# Patient Record
Sex: Female | Born: 1955 | Race: Black or African American | Hispanic: No | Smoking: Never smoker
Health system: Southern US, Community
[De-identification: ages and names within clinical notes are randomized; demographics above are authoritative.]

## PROBLEM LIST (undated history)

## (undated) DIAGNOSIS — L509 Urticaria, unspecified: Secondary | ICD-10-CM

## (undated) DIAGNOSIS — K219 Gastro-esophageal reflux disease without esophagitis: Secondary | ICD-10-CM

## (undated) DIAGNOSIS — F419 Anxiety disorder, unspecified: Secondary | ICD-10-CM

## (undated) DIAGNOSIS — T7840XA Allergy, unspecified, initial encounter: Secondary | ICD-10-CM

## (undated) DIAGNOSIS — T783XXA Angioneurotic edema, initial encounter: Secondary | ICD-10-CM

## (undated) DIAGNOSIS — G4730 Sleep apnea, unspecified: Secondary | ICD-10-CM

## (undated) DIAGNOSIS — F32A Depression, unspecified: Secondary | ICD-10-CM

## (undated) DIAGNOSIS — F329 Major depressive disorder, single episode, unspecified: Secondary | ICD-10-CM

## (undated) HISTORY — DX: Anxiety disorder, unspecified: F41.9

## (undated) HISTORY — DX: Allergy, unspecified, initial encounter: T78.40XA

## (undated) HISTORY — DX: Angioneurotic edema, initial encounter: T78.3XXA

## (undated) HISTORY — DX: Gastro-esophageal reflux disease without esophagitis: K21.9

## (undated) HISTORY — PX: NO PAST SURGERIES: SHX2092

## (undated) HISTORY — DX: Urticaria, unspecified: L50.9

## (undated) HISTORY — PX: UPPER GASTROINTESTINAL ENDOSCOPY: SHX188

## (undated) HISTORY — DX: Sleep apnea, unspecified: G47.30

## (undated) HISTORY — PX: BREAST CYST ASPIRATION: SHX578

## (undated) HISTORY — PX: COLONOSCOPY: SHX174

---

## 2004-12-09 ENCOUNTER — Emergency Department (HOSPITAL_COMMUNITY): Admission: EM | Admit: 2004-12-09 | Discharge: 2004-12-09 | Payer: Self-pay | Admitting: Emergency Medicine

## 2011-12-05 ENCOUNTER — Encounter: Payer: Self-pay | Admitting: Family

## 2011-12-05 ENCOUNTER — Ambulatory Visit (INDEPENDENT_AMBULATORY_CARE_PROVIDER_SITE_OTHER): Payer: 59 | Admitting: Family

## 2011-12-05 VITALS — BP 100/58 | Temp 98.8°F | Ht 71.0 in | Wt 196.0 lb

## 2011-12-05 DIAGNOSIS — F410 Panic disorder [episodic paroxysmal anxiety] without agoraphobia: Secondary | ICD-10-CM

## 2011-12-05 DIAGNOSIS — F411 Generalized anxiety disorder: Secondary | ICD-10-CM

## 2011-12-05 DIAGNOSIS — F419 Anxiety disorder, unspecified: Secondary | ICD-10-CM

## 2011-12-05 DIAGNOSIS — F329 Major depressive disorder, single episode, unspecified: Secondary | ICD-10-CM

## 2011-12-05 MED ORDER — CITALOPRAM HYDROBROMIDE 20 MG PO TABS: 20.0000 mg | ORAL_TABLET | Freq: Every day | ORAL | Status: DC

## 2011-12-05 MED ORDER — ALPRAZOLAM 0.25 MG PO TABS: 0.5000 mg | ORAL_TABLET | Freq: Two times a day (BID) | ORAL | Status: AC | PRN

## 2011-12-05 NOTE — Patient Instructions (Signed)

## 2011-12-05 NOTE — Progress Notes (Signed)
  Subjective:    Patient ID: Misty Burke, female    DOB: 1955/12/12, 56 y.o.   MRN: [id number]  HPI 56 year old [place] American female, new patient to the practice is in reestablish. Reports her mother dying at [facility] in February of this year. She's been have an red difficulty adjusting to her mother's death. Reports it being a traumatic death with her vomiting stomach secretions been passing overdid in her hospital bed. Patient is very upset that the doctors and nurses did not attempt around by her mother. She's seeking legal assistance. Her mother had a DO NOT RESUSCITATE order. Now, she has chest tightness, difficulty breathing, crying spells, depression, symptoms of helplessness and hopelessness. She does not have much of a support system, she is the only child and is unmarried with no children. She is from [place] and is only in [place] to settle her mother estate.   Review of Systems  Constitutional: Negative.   Respiratory: Negative.   Cardiovascular: Negative.   Musculoskeletal: Negative.   Skin: Negative.   Neurological: Negative.   Psychiatric/Behavioral: Positive for sleep disturbance, decreased concentration and agitation. The patient is nervous/anxious.    No past medical history on file.  History   Social History  . Marital Status: Single    Spouse Name: N/A    Number of Children: N/A  . Years of Education: N/A   Occupational History  . Not on file.   Social History Main Topics  . Smoking status: Never Smoker   . Smokeless tobacco: Not on file  . Alcohol Use: No  . Drug Use: No  . Sexually Active: Not on file   Other Topics Concern  . Not on file   Social History Narrative  . No narrative on file    No past surgical history on file.  No family history on file.  No Known Allergies  Current Outpatient Prescriptions on File Prior to Visit  Medication Sig Dispense Refill  . citalopram (CELEXA) 20 MG tablet Take  1 tablet (20 mg total) by mouth daily.  30 tablet  3    BP 100/58  Temp(Src) 98.8 F (37.1 C) (Oral)  Ht 5\' 11"  (1.803 m)  Wt 196 lb (88.905 kg)  BMI 27.34 kg/m2chart    Objective:   Physical Exam  Constitutional: She is oriented to person, place, and time. She appears well-developed and well-nourished.  Neck: Normal range of motion. Neck supple.  Cardiovascular: Normal rate, regular rhythm and normal heart sounds.   Pulmonary/Chest: Effort normal and breath sounds normal.  Neurological: She is alert and oriented to person, place, and time.  Skin: Skin is warm and dry.  Psychiatric: She has a normal mood and affect.          Assessment & Plan:  Assessment: Anxiety, depression, panic attacks  Plan: We'll start Celexa 10 mg once a day, Xanax 0.25 mg twice a day when necessary. Advised exercise. We'll followup with patient in 2 weeks and sooner when necessary.

## 2011-12-11 ENCOUNTER — Telehealth: Payer: Self-pay | Admitting: Family

## 2011-12-11 NOTE — Telephone Encounter (Signed)
Pt called re: Celexa. Pt wants to know if she needs to take med a same time each day. Pt said that she took first dose last night and wanted to know if she is suppose to wait until tonight to take again?

## 2011-12-11 NOTE — Telephone Encounter (Signed)
Left message on personally identified voicemail to notify pt that she should take Celexa around the same time daily. Advised pt to call back with further questions or concerns

## 2011-12-12 ENCOUNTER — Other Ambulatory Visit: Payer: Self-pay | Admitting: Family

## 2011-12-12 NOTE — Telephone Encounter (Signed)
Pt aware that it is ok to cut the pill in half and that she should take it around the same time each night.Also advised pt that when making the decision to d/c the meds, she would need to be weaned off of it, it should not be stopped all at once.  Pt verbalized understanding.

## 2011-12-12 NOTE — Telephone Encounter (Signed)
Pt stated celexa 20mg  is too stronger she is cutting pills in half. Celexa 20mg  is making her sleepy and feeling funny in her chest. Please advise. Pt just started taking medication 2 day ago.

## 2011-12-17 ENCOUNTER — Ambulatory Visit (INDEPENDENT_AMBULATORY_CARE_PROVIDER_SITE_OTHER): Payer: 59 | Admitting: Family

## 2011-12-17 ENCOUNTER — Encounter: Payer: Self-pay | Admitting: Family

## 2011-12-17 VITALS — BP 98/62 | HR 84 | Temp 98.6°F | Wt 196.0 lb

## 2011-12-17 DIAGNOSIS — F419 Anxiety disorder, unspecified: Secondary | ICD-10-CM

## 2011-12-17 DIAGNOSIS — F411 Generalized anxiety disorder: Secondary | ICD-10-CM

## 2011-12-17 DIAGNOSIS — F329 Major depressive disorder, single episode, unspecified: Secondary | ICD-10-CM

## 2011-12-17 NOTE — Progress Notes (Signed)
  Subjective:    Patient ID: Misty Burke, female    DOB: [birth year], 56 y.o.   MRN: [id number]  HPI 56 year old [place] American female, nonsmoker is in for recheck depression and anxiety. Patient was started on Celexa 20 mg. Patient reports taking 2 doses of the medication and felt like her chest was tight, headaches, increase in depression, and difficulty swallowing after taking the second dose. She has since discontinued the medication. She is still in [place] although also due to [facility] for her mother's death. However, she is becoming a more discouraged since the autopsy report showed the cause of death was endstage renal disease. Continues to have feelings of helplessness and hopelessness. Denies any thoughts of death or dying or thoughts of suicide.   Review of Systems  Constitutional: Negative.   Respiratory: Positive for chest tightness and shortness of breath.   Cardiovascular: Negative.   Musculoskeletal: Negative.   Skin: Negative.   Neurological: Positive for headaches.  Hematological: Negative.   Psychiatric/Behavioral: The patient is nervous/anxious.    No past medical history on file.  History   Social History  . Marital Status: Single    Spouse Name: N/A    Number of Children: N/A  . Years of Education: N/A   Occupational History  . Not on file.   Social History Main Topics  . Smoking status: Never Smoker   . Smokeless tobacco: Not on file  . Alcohol Use: No  . Drug Use: No  . Sexually Active: Not on file   Other Topics Concern  . Not on file   Social History Narrative  . No narrative on file    No past surgical history on file.  No family history on file.  No Known Allergies  Current Outpatient Prescriptions on File Prior to Visit  Medication Sig Dispense Refill  . ALPRAZolam (XANAX) 0.25 MG tablet Take 2 tablets (0.5 mg total) by mouth 2 (two) times daily as needed for sleep.  60 tablet  0  . citalopram (CELEXA) 20 MG tablet Take 1  tablet (20 mg total) by mouth daily.  30 tablet  3    BP 98/62  Pulse 84  Temp(Src) 98.6 F (37 C) (Oral)  Wt 196 lb (88.905 kg)  SpO2 98%chart    Objective:   Physical Exam  Constitutional: She is oriented to person, place, and time. She appears well-developed and well-nourished.  HENT:  Right Ear: External ear normal.  Left Ear: External ear normal.  Nose: Nose normal.  Mouth/Throat: Oropharynx is clear and moist.  Neck: Normal range of motion. Neck supple.  Cardiovascular: Normal rate, regular rhythm and normal heart sounds.   Pulmonary/Chest: Effort normal and breath sounds normal.  Neurological: She is alert and oriented to person, place, and time.  Skin: Skin is warm and dry.  Psychiatric: She has a normal mood and affect.          Assessment & Plan:  Assessment: Anxiety, Depression  Plan: Patient does not with to take any more SSRI's. I have advised her to take some time away from  since she has some unpleasant memories. Continue to see a therapist. Xanax prn at bedtime. Call if symptoms worsen or persist.

## 2013-06-28 ENCOUNTER — Encounter (HOSPITAL_COMMUNITY): Payer: Self-pay | Admitting: Emergency Medicine

## 2013-06-28 ENCOUNTER — Emergency Department (INDEPENDENT_AMBULATORY_CARE_PROVIDER_SITE_OTHER)
Admission: EM | Admit: 2013-06-28 | Discharge: 2013-06-28 | Disposition: A | Discharge status: Home / Self Care | Payer: Managed Care, Other (non HMO) | Source: Home / Self Care | Attending: Emergency Medicine | Admitting: Emergency Medicine

## 2013-06-28 DIAGNOSIS — L089 Local infection of the skin and subcutaneous tissue, unspecified: Secondary | ICD-10-CM

## 2013-06-28 DIAGNOSIS — Z23 Encounter for immunization: Secondary | ICD-10-CM

## 2013-06-28 DIAGNOSIS — S61209A Unspecified open wound of unspecified finger without damage to nail, initial encounter: Secondary | ICD-10-CM

## 2013-06-28 MED ORDER — TETANUS-DIPHTHERIA TOXOIDS TD 5-2 LFU IM INJ
0.5000 mL | INJECTION | Freq: Once | INTRAMUSCULAR | Status: AC
  Administered 2013-06-28: 0.5 mL via INTRAMUSCULAR

## 2013-06-28 MED ORDER — TETANUS-DIPHTH-ACELL PERTUSSIS 5-2.5-18.5 LF-MCG/0.5 IM SUSP
INTRAMUSCULAR | Status: AC
  Filled 2013-06-28: qty 0.5

## 2013-06-28 MED ORDER — CEPHALEXIN 500 MG PO CAPS: 500.0000 mg | ORAL_CAPSULE | Freq: Four times a day (QID) | ORAL | Status: DC

## 2013-06-28 NOTE — ED Provider Notes (Signed)
CSN: [id number]     Arrival date & time 06/28/13  1833 History   First MD Initiated Contact with Patient 06/28/13 1912     Chief Complaint  Patient presents with  . Hand Problem   (Consider location/radiation/quality/duration/timing/severity/associated sxs/prior Treatment) HPI Comments: Ms. Boyack presents to the UC for evaluation of a painful right thumb. She state symptoms began about one month ago after working in her yard, pulling and clearing cines. States she thought she suffered a puncture wound from one of the thorns on a vine. States she thought she was able to remove the foreign body, but she continues to have discomfort, redness and swelling where injury occurred. Denies any visible foreign body, open wound or drainage from area. Tetanus status unknown.   The history is provided by the patient.    History reviewed. No pertinent past medical history. History reviewed. No pertinent past surgical history. History reviewed. No pertinent family history. History  Substance Use Topics  . Smoking status: Never Smoker   . Smokeless tobacco: Not on file  . Alcohol Use: No   OB History   Grav Para Term Preterm Abortions TAB SAB Ect Mult Living                 Review of Systems  All other systems reviewed and are negative.    Allergies  Review of patient's allergies indicates no known allergies.  Home Medications   Current Outpatient Rx  Name  Route  Sig  Dispense  Refill  . cephALEXin (KEFLEX) 500 MG capsule   Oral   Take 1 capsule (500 mg total) by mouth 4 (four) times daily. X 7 days   28 capsule   0   . EXPIRED: citalopram (CELEXA) 20 MG tablet   Oral   Take 1 tablet (20 mg total) by mouth daily.   30 tablet   3    BP 118/81  Pulse 71  Temp(Src) 98 F (36.7 C) (Oral)  Resp 16  SpO2 100% Physical Exam  Nursing note and vitals reviewed. Constitutional: She is oriented to person, place, and time. She appears well-developed and well-nourished. No distress.    HENT:  Head: Normocephalic and atraumatic.  Cardiovascular: Normal rate and regular rhythm.   Pulmonary/Chest: Effort normal and breath sounds normal.  Abdominal: Bowel sounds are normal. There is no tenderness.  Musculoskeletal:       Hands: Neurological: She is alert and oriented to person, place, and time.  Skin: Skin is warm and dry. There is erythema.  Psychiatric: She has a normal mood and affect. Her behavior is normal.    ED Course  Procedures (including critical care time) Labs Review Labs Reviewed - No data to display Imaging Review No results found.  EKG Interpretation    Date/Time:    Ventricular Rate:    PR Interval:    QRS Duration:   QT Interval:    QTC Calculation:   R Axis:     Text Interpretation:              MDM   1. Laceration of thumb with infection, right, initial encounter     Advised that it would be best for her to follow up with a hand specialist for evaluation of possible retained foreign body in right thumb. Tetanus booster given.    Jess Barters [place], [place] 06/29/13 0800

## 2013-06-28 NOTE — ED Notes (Signed)
C/o possible infection of the thumb of the right hand. States 2 wks ago got caught in vine.  Having pain and mild swelling. Pt has used rubbing alcohol and tried to lance/drain area with no relief.

## 2013-06-29 NOTE — ED Provider Notes (Signed)
Medical screening examination/treatment/procedure(s) were performed by non-physician practitioner and as supervising physician I was immediately available for consultation/collaboration.  Leslee Home, M.D.   Reuben Likes, MD 06/29/13 [id number]

## 2013-06-29 NOTE — ED Notes (Signed)
Pt. called and said that Dr. Butler Denmark does not accept her insurance.  I told her to call her insurance company and ask what hand specialist they do cover.  She does not know the number of [employer].  She asked me to look up the number for her because she lost her card.  I told her I would call back. I had Aram Beecham look but she said the only cards on her file were [employer] and Medicare. She tried to put pt.'s [employer] number in the program to get their phone number but it declined her number. I called pt. back and gave her this information. She said she called the pharmacy and they had her card on file. She was able to get her Rx.'s and got a number to call tomorrow about ortho referral. I told her we can send her record wherever she needs [place] to send it. Vassie Moselle 06/29/2013

## 2013-07-01 NOTE — ED Notes (Signed)
Chart review; message still on answering machine, appears to be resolved

## 2014-02-20 ENCOUNTER — Emergency Department (INDEPENDENT_AMBULATORY_CARE_PROVIDER_SITE_OTHER): Payer: Managed Care, Other (non HMO)

## 2014-02-20 ENCOUNTER — Emergency Department (INDEPENDENT_AMBULATORY_CARE_PROVIDER_SITE_OTHER)
Admission: EM | Admit: 2014-02-20 | Discharge: 2014-02-20 | Disposition: A | Discharge status: Home / Self Care | Payer: Managed Care, Other (non HMO) | Source: Home / Self Care | Attending: Family Medicine | Admitting: Family Medicine

## 2014-02-20 ENCOUNTER — Encounter (HOSPITAL_COMMUNITY): Payer: Self-pay | Admitting: Emergency Medicine

## 2014-02-20 DIAGNOSIS — J45909 Unspecified asthma, uncomplicated: Secondary | ICD-10-CM

## 2014-02-20 DIAGNOSIS — J4530 Mild persistent asthma, uncomplicated: Secondary | ICD-10-CM

## 2014-02-20 LAB — POCT URINALYSIS DIP (DEVICE)
BILIRUBIN URINE: NEGATIVE
Glucose, UA: NEGATIVE mg/dL
KETONES UR: NEGATIVE mg/dL
Leukocytes, UA: NEGATIVE
Nitrite: NEGATIVE
PH: 5 (ref 5.0–8.0)
Protein, ur: NEGATIVE mg/dL
Specific Gravity, Urine: >=1.03 (ref 1.005–1.030)
Urobilinogen, UA: 0.2 mg/dL (ref 0.0–1.0)

## 2014-02-20 MED ORDER — TRIAMCINOLONE ACETONIDE 40 MG/ML IJ SUSP
40.0000 mg | Freq: Once | INTRAMUSCULAR | Status: AC
  Administered 2014-02-20: 40 mg via INTRAMUSCULAR

## 2014-02-20 MED ORDER — IPRATROPIUM BROMIDE 0.06 % NA SOLN: 2.0000 | Freq: Four times a day (QID) | NASAL | Status: DC

## 2014-02-20 MED ORDER — TRIAMCINOLONE ACETONIDE 40 MG/ML IJ SUSP
INTRAMUSCULAR | Status: AC
  Filled 2014-02-20: qty 1

## 2014-02-20 MED ORDER — METHYLPREDNISOLONE 4 MG PO KIT: PACK | ORAL | Status: DC

## 2014-02-20 NOTE — ED Notes (Signed)
Pt  Has  Multiple  Symptoms  To  Include    Cough   /  Congestion       Weakness   And  Cough  For  About  6  Weeks  The pt  Reports       Pt    Also  Reports      Some  Dark  Foul  Urine  As  Well

## 2014-02-20 NOTE — ED Provider Notes (Signed)
CSN: [id number]     Arrival date & time 02/20/14  1516 History   First MD Initiated Contact with Patient 02/20/14 1540     Chief Complaint  Patient presents with  . URI   (Consider location/radiation/quality/duration/timing/severity/associated sxs/prior Treatment) Patient is a 58 y.o. female presenting with URI. The history is provided by the patient.  URI Presenting symptoms: congestion, cough, fatigue and rhinorrhea   Severity:  Moderate Onset quality:  Gradual Duration:  6 weeks Progression:  Unchanged Chronicity:  New (down from [place] to settle mother's estate after death.) Relieved by:  None tried Ineffective treatments:  None tried Associated symptoms: no wheezing   Risk factors: recent travel     History reviewed. No pertinent past medical history. History reviewed. No pertinent past surgical history. History reviewed. No pertinent family history. History  Substance Use Topics  . Smoking status: Never Smoker   . Smokeless tobacco: Not on file  . Alcohol Use: No   OB History   Grav Para Term Preterm Abortions TAB SAB Ect Mult Living                 Review of Systems  Constitutional: Positive for fatigue.  HENT: Positive for congestion, postnasal drip and rhinorrhea.   Respiratory: Positive for cough. Negative for shortness of breath and wheezing.   Gastrointestinal: Negative.   Genitourinary: Negative.     Allergies  Review of patient's allergies indicates no known allergies.  Home Medications   Prior to Admission medications   Medication Sig Start Date End Date Taking? Authorizing Provider  cephALEXin (KEFLEX) 500 MG capsule Take 1 capsule (500 mg total) by mouth 4 (four) times daily. X 7 days 06/28/13   Ardis RowanJennifer Lee Presson, PA  citalopram (CELEXA) 20 MG tablet Take 1 tablet (20 mg total) by mouth daily. 12/05/11 12/04/12  Baker PieriniPadonda B Campbell, FNP  ipratropium (ATROVENT) 0.06 % nasal spray Place 2 sprays into both nostrils 4 (four) times daily. 02/20/14   Linna HoffJames D  Tanisha Lutes, MD  methylPREDNISolone (MEDROL DOSEPAK) 4 MG tablet follow package directions, start on tues, take until finished. 02/20/14   Linna HoffJames D Deshawnda Acrey, MD   BP 119/82  Pulse 79  Temp(Src) 98.4 F (36.9 C) (Oral)  Resp 18  Ht 5\' 10"  (1.778 m)  Wt 195 lb (88.451 kg)  BMI 27.98 kg/m2  SpO2 96% Physical Exam  Nursing note and vitals reviewed. Constitutional: She is oriented to person, place, and time. She appears well-developed and well-nourished.  HENT:  Right Ear: External ear normal.  Left Ear: External ear normal.  Mouth/Throat: Oropharynx is clear and moist.  Eyes: Pupils are equal, round, and reactive to light.  Neck: Normal range of motion. Neck supple.  Cardiovascular: Regular rhythm, normal heart sounds and intact distal pulses.   Pulmonary/Chest: Effort normal and breath sounds normal.  Neurological: She is alert and oriented to person, place, and time.  Skin: Skin is warm and dry.    ED Course  Procedures (including critical care time) Labs Review Labs Reviewed  POCT URINALYSIS DIP (DEVICE) - Abnormal; Notable for the following:    Hgb urine dipstick TRACE (*)    All other components within normal limits    Imaging Review Dg Chest 2 View  02/20/2014   CLINICAL DATA:  Cough and congestion and chest tightness for 4 weeks  EXAM: CHEST  2 VIEW  COMPARISON:  None.  FINDINGS: There is mild elevation of the right hemidiaphragm. Anteriorly. The lungs are well-expanded and clear. There is no  focal infiltrate. The heart and pulmonary vascularity are normal. There is no pleural effusion. The bony thorax is unremarkable.  IMPRESSION: There is no acute cardiopulmonary abnormality.   Electronically Signed   By: David  [place]   On: 02/20/2014 16:44   X-rays reviewed and report per radiologist.   MDM   1. Bronchitis, allergic, mild persistent, uncomplicated        Linna Hoff, MD 02/20/14 1654

## 2014-03-01 ENCOUNTER — Encounter (HOSPITAL_COMMUNITY): Payer: Self-pay | Admitting: Emergency Medicine

## 2014-03-01 ENCOUNTER — Emergency Department (INDEPENDENT_AMBULATORY_CARE_PROVIDER_SITE_OTHER)
Admission: EM | Admit: 2014-03-01 | Discharge: 2014-03-01 | Disposition: A | Discharge status: Home / Self Care | Payer: Managed Care, Other (non HMO) | Source: Home / Self Care | Attending: Emergency Medicine | Admitting: Emergency Medicine

## 2014-03-01 DIAGNOSIS — R05 Cough: Secondary | ICD-10-CM

## 2014-03-01 DIAGNOSIS — J209 Acute bronchitis, unspecified: Secondary | ICD-10-CM

## 2014-03-01 DIAGNOSIS — J019 Acute sinusitis, unspecified: Secondary | ICD-10-CM

## 2014-03-01 DIAGNOSIS — H5319 Other subjective visual disturbances: Secondary | ICD-10-CM

## 2014-03-01 DIAGNOSIS — R059 Cough, unspecified: Secondary | ICD-10-CM

## 2014-03-01 LAB — POCT I-STAT, CHEM 8
BUN: 17 mg/dL (ref 6–23)
CHLORIDE: 104 meq/L (ref 96–112)
Calcium, Ion: 1.16 mmol/L (ref 1.12–1.23)
Creatinine, Ser: 1 mg/dL (ref 0.50–1.10)
Glucose, Bld: 88 mg/dL (ref 70–99)
HCT: 42 % (ref 36.0–46.0)
Hemoglobin: 14.3 g/dL (ref 12.0–15.0)
POTASSIUM: 4.1 meq/L (ref 3.7–5.3)
Sodium: 137 mEq/L (ref 137–147)
TCO2: 26 mmol/L (ref 0–100)

## 2014-03-01 MED ORDER — HYDROCOD POLST-CHLORPHEN POLST 10-8 MG/5ML PO LQCR: 5.0000 mL | Freq: Two times a day (BID) | ORAL | Status: DC | PRN

## 2014-03-01 MED ORDER — FLUTICASONE PROPIONATE 50 MCG/ACT NA SUSP: 2.0000 | Freq: Every day | NASAL | Status: DC

## 2014-03-01 MED ORDER — AMOXICILLIN 500 MG PO CAPS: 1000.0000 mg | ORAL_CAPSULE | Freq: Three times a day (TID) | ORAL | Status: DC

## 2014-03-01 MED ORDER — ALBUTEROL SULFATE HFA 108 (90 BASE) MCG/ACT IN AERS: 2.0000 | INHALATION_SPRAY | Freq: Four times a day (QID) | RESPIRATORY_TRACT | Status: DC

## 2014-03-01 NOTE — Discharge Instructions (Signed)
How to Use an Inhaler °Proper inhaler technique is very important. Good technique ensures that the medicine reaches the lungs. Poor technique results in depositing the medicine on the tongue and back of the throat rather than in the airways. If you do not use the inhaler with good technique, the medicine will not help you. °STEPS TO FOLLOW IF USING AN INHALER WITHOUT AN EXTENSION TUBE °1. Remove the cap from the inhaler. °2. If you are using the inhaler for the first time, you will need to prime it. Shake the inhaler for 5 seconds and release four puffs into the air, away from your face. Ask your health care provider or pharmacist if you have questions about priming your inhaler. °3. Shake the inhaler for 5 seconds before each breath in (inhalation). °4. Position the inhaler so that the top of the canister faces up. °5. Put your index finger on the top of the medicine canister. Your thumb supports the bottom of the inhaler. °6. Open your mouth. °7. Either place the inhaler between your teeth and place your lips tightly around the mouthpiece, or hold the inhaler 1-2 inches away from your open mouth. If you are unsure of which technique to use, ask your health care provider. °8. Breathe out (exhale) normally and as completely as possible. °9. Press the canister down with your index finger to release the medicine. °10. At the same time as the canister is pressed, inhale deeply and slowly until your lungs are completely filled. This should take 4-6 seconds. Keep your tongue down. °11. Hold the medicine in your lungs for 5-10 seconds (10 seconds is best). This helps the medicine get into the small airways of your lungs. °12. Breathe out slowly, through pursed lips. Whistling is an example of pursed lips. °13. Wait at least 15-30 seconds between puffs. Continue with the above steps until you have taken the number of puffs your health care provider has ordered. Do not use the inhaler more than your health care provider  tells you. °14. Replace the cap on the inhaler. °15. Follow the directions from your health care provider or the inhaler insert for cleaning the inhaler. °STEPS TO FOLLOW IF USING AN INHALER WITH AN EXTENSION (SPACER) °1. Remove the cap from the inhaler. °2. If you are using the inhaler for the first time, you will need to prime it. Shake the inhaler for 5 seconds and release four puffs into the air, away from your face. Ask your health care provider or pharmacist if you have questions about priming your inhaler. °3. Shake the inhaler for 5 seconds before each breath in (inhalation). °4. Place the open end of the spacer onto the mouthpiece of the inhaler. °5. Position the inhaler so that the top of the canister faces up and the spacer mouthpiece faces you. °6. Put your index finger on the top of the medicine canister. Your thumb supports the bottom of the inhaler and the spacer. °7. Breathe out (exhale) normally and as completely as possible. °8. Immediately after exhaling, place the spacer between your teeth and into your mouth. Close your lips tightly around the spacer. °9. Press the canister down with your index finger to release the medicine. °10. At the same time as the canister is pressed, inhale deeply and slowly until your lungs are completely filled. This should take 4-6 seconds. Keep your tongue down and out of the way. °11. Hold the medicine in your lungs for 5-10 seconds (10 seconds is best). This helps the   medicine get into the small airways of your lungs. Exhale. °12. Repeat inhaling deeply through the spacer mouthpiece. Again hold that breath for up to 10 seconds (10 seconds is best). Exhale slowly. If it is difficult to take this second deep breath through the spacer, breathe normally several times through the spacer. Remove the spacer from your mouth. °13. Wait at least 15-30 seconds between puffs. Continue with the above steps until you have taken the number of puffs your health care provider has  ordered. Do not use the inhaler more than your health care provider tells you. °14. Remove the spacer from the inhaler, and place the cap on the inhaler. °15. Follow the directions from your health care provider or the inhaler insert for cleaning the inhaler and spacer. °If you are using different kinds of inhalers, use your quick relief medicine to open the airways 10-15 minutes before using a steroid if instructed to do so by your health care provider. If you are unsure which inhalers to use and the order of using them, ask your health care provider, nurse, or respiratory therapist. °If you are using a steroid inhaler, always rinse your mouth with water after your last puff, then gargle and spit out the water. Do not swallow the water. °AVOID: °· Inhaling before or after starting the spray of medicine. It takes practice to coordinate your breathing with triggering the spray. °· Inhaling through the nose (rather than the mouth) when triggering the spray. °HOW TO DETERMINE IF YOUR INHALER IS FULL OR NEARLY EMPTY °You cannot know when an inhaler is empty by shaking it. A few inhalers are now being made with dose counters. Ask your health care provider for a prescription that has a dose counter if you feel you need that extra help. If your inhaler does not have a counter, ask your health care provider to help you determine the date you need to refill your inhaler. Write the refill date on a calendar or your inhaler canister. Refill your inhaler 7-10 days before it runs out. Be sure to keep an adequate supply of medicine. This includes making sure it is not expired, and that you have a spare inhaler.  °SEEK MEDICAL CARE IF:  °· Your symptoms are only partially relieved with your inhaler. °· You are having trouble using your inhaler. °· You have some increase in phlegm. °SEEK IMMEDIATE MEDICAL CARE IF:  °· You feel little or no relief with your inhalers. You are still wheezing and are feeling shortness of breath or  tightness in your chest or both. °· You have dizziness, headaches, or a fast heart rate. °· You have chills, fever, or night sweats. °· You have a noticeable increase in phlegm production, or there is blood in the phlegm. °MAKE SURE YOU:  °· Understand these instructions. °· Will watch your condition. °· Will get help right away if you are not doing well or get worse. °Document Released: 07/11/2000 Document Revised: 05/04/2013 Document Reviewed: 02/10/2013 °[employer]® Patient Information ©2015 [employer]. This information is not intended to replace advice given to you by your health care provider. Make sure you discuss any questions you have with your health care provider. ° °Most upper respiratory infections are caused by viruses and do not require antibiotics.  We try to save the antibiotics for when we really need them to prevent bacteria from developing resistance to them.  Here are a few hints about things that can be done at home to help get over an   upper respiratory infection quicker: ° °Get extra sleep and extra fluids.  Get 7 to 9 hours of sleep per night and 6 to 8 glasses of water a day.  Getting extra sleep keeps the immune system from getting run down.  Most people with an upper respiratory infection are a little dehydrated.  The extra fluids also keep the secretions liquified and easier to deal with.  Also, get extra vitamin C.  4000 mg per day is the recommended dose. °For the aches, headache, and fever, acetaminophen or ibuprofen are helpful.  These can be alternated every 4 hours.  People with liver disease should avoid large amounts of acetaminophen, and people with ulcer disease, gastroesophageal reflux, gastritis, congestive heart failure, chronic kidney disease, coronary artery disease and the elderly should avoid ibuprofen. °For nasal congestion try Mucinex-D, or if you're having lots of sneezing or clear nasal drainage use Zyrtec-D. People with high blood pressure can take these if their  blood pressure is controlled, if not, it's best to avoid the forms with a "D" (decongestants).  You can use the plain Mucinex, Allegra, Claritin, or Zyrtec even if your blood pressure is not controlled.   °A Saline nasal spray such as Ocean Spray can also help.  You can add a decongestant sprays such as Afrin, but you should not use the decongestant sprays for more than 3 or 4 days since they can be habituating.  Breathe Rite nasal strips can also offer a non-drug alternative treatment to nasal congestion, especially at night. °For people with symptoms of sinusitis, sleeping with your head elevated can be helpful.  For sinus pain, moist, hot compresses to the face may provide some relief.  Many people find that inhaling steam as in a shower or from a pot of steaming water can help. °For any viral infection, zinc containing lozenges such as Cold-Eze or Zicam are helpful.  Zinc helps to fight viral infection.  Hot salt water gargles (8 oz of hot water, 1/2 tsp of table salt, and a pinch of baking soda) can give relief as well as hot beverages such as hot tea.  Sucrets extra strength lozenges will help the sore throat.  °For the cough, take Delsym 2 tsp every 12 hours.  It has also been found recently that Aleve can help control a cough.  The dose is 1 to 2 tablets twice daily with food.  This can be combined with Delsym. (Note, if you are taking ibuprofen, you should not take Aleve as well--take one or the other.) °A cool mist vaporizer will help keep your mucous membranes from drying out.  ° °It's important when you have an upper respiratory infection not to pass the infection to others.  This involves being very careful about the following: ° °Frequent hand washing or use of hand sanitizer, especially after coughing, sneezing, blowing your nose or touching your face, nose or eyes. °Do not shake hands or touch anyone and try to avoid touching surfaces that other people use such as doorknobs, shopping carts,  telephones and computer keyboards. °Use tissues and dispose of them properly in a garbage can or ziplock bag. °Cough into your sleeve. °Do not let others eat or drink after you. ° °It's also important to recognize the signs of serious illness and get evaluated if they occur: °Any respiratory infection that lasts more than 7 to 10 days.  Yellow nasal drainage and sputum are not reliable indicators of a bacterial infection, but if they last for more than   1 week, see your doctor. Fever and sore throat can indicate strep. Fever and cough can indicate influenza or pneumonia. Any kind of severe symptom such as difficulty breathing, intractable vomiting, or severe pain should prompt you to see a doctor as soon as possible.   Your body's immune system is really the thing that will get rid of this infection.  Your immune system is comprised of 2 types of specialized cells called T cells and B cells.  T cells coordinate the array of cells in your body that engulf invading bacteria or viruses while B cells orchestrate the production of antibodies that neutralize infection.  Anything we do or any medications we give you, will just strengthen your immune system or help it clear up the infection quicker.  Here are a few helpful hints to improve your immune system to help overcome this illness or to prevent future infections:  A few vitamins can improve the health of your immune system.  That's why your diet should include plenty of fruits, vegetables, fish, nuts, and whole grains.  Vitamin A and bet-carotene can increase the cells that fight infections (T cells and B cells).  Vitamin A is abundant in dark greens and orange vegetables such as spinach, greens, sweet potatoes, and carrots.  Vitamin B6 contributes to the maturation of white blood cells, the cells that fight disease.  Foods with vitamin B6 include cold cereal and bananas.  Vitamin C is credited with preventing colds because it increases white blood cells  and also prevents cellular damage.  Citrus fruits, peaches and green and red bell peppers are all hight in vitamin C.  Vitamin E is an anti-oxidant that encourages the production of natural killer cells which reject foreign invaders and B cells that produce antibodies.  Foods high in vitamin E include wheat germ, nuts and seeds.  Foods high in omega-3 fatty acids found in foods like salmon, tuna and mackerel boost your immune system and help cells to engulf and absorb germs.  Probiotics are good bacteria that increase your T cells.  These can be found in yogurt and are available in supplements such as Culturelle or Align.  Moderate exercise increases the strength of your immune system and your ability to recover from illness.  I suggest 3 to 5 moderate intensity 30 minute workouts per week.    Sleep is another component of maintaining a strong immune system.  It enables your body to recuperate from the day's activities, stress and work.  My recommendation is to get between 7 and 9 hours of sleep per night.  If you smoke, try to quit completely or at least cut down.  Drink alcohol only in moderation if at all.  No more than 2 drinks daily for men or 1 for women.  Get a flu vaccine early in the fall or if you have not gotten one yet, once this illness has run its course.  If you are over 65, a smoker, or an asthmatic, get a pneumococcal vaccine.  My final recommendation is to maintain a healthy weight.  Excess weight can impair the immune system by interfering with the way the immune system deals with invading viruses or bacteria.   People who suffer from allergies frequently have symptoms of nasal congestion, runny nose, sneezing, itching of the nose, eyes, ears or throat, mucous in the throat, watering of the eyes and cough.  These symptoms are caused by the body's immune response to environmental allergens.  For seasonal allergies this  is pollen (tree pollen in the spring, grass pollen in the  summer, and weed pollen in the fall).  Year round allergy symptoms are usually caused by dust or mould.  Many people have year round symptoms which are worse seasonally.  For people who have seasonal allergies, pollen avoidance may help to decease symptoms.  This means keeping windows in the house down and windows in the car up.  Run your air conditioning, since this filters out many of the pollen particles.  If you have to spend a prolonged time outdoors during heavy pollen season, it might be prudent to wear a mask.  These can be purchased at any drug store.  When you come in after heavy pollen exposure, your skin, clothing and hair are covered with pollen.  Changing your clothing, taking a shower, and washing your hair may help with your pollen exposure.  Also, your bedding, pillow, and pillowcase may become contaminated with pollen, so frequent washing of your bedding and pillowcase and changing out your pillow may help as well.  (Your pillow can also be a source of dust and mould exposure as well.)  Showering at bedtime may also help.  During heavy pollen season (April and September), a large amount of pollen gets trapped in your nasal cavity.  This can contribute to ongoing allergy symptoms.  Saline irrigation of the nasal cavity can help to remove this and relieve allergy symptoms.  This can be accomplished in several ways.  You can mix up your own saline solution using the following recipe:  8 oz of distilled or boiled water, 1/2 tsp of table salt (sodium choride), and a pinch of baking soda (sodium bicarbonate).  If nasal congestion is a problem.  1 to 2 drops of Afrin solution can be added to this as well.  To do the irrigation, purchase a nasal bulb syringe (the kind you would use to clean out an infant's nose).  Fill this up with the solution, lean you head over a sink with the nostril to be irrigated turned upward, insert the syringe into your nostril, making a tight seal, and gently irrigate,  compressing the bulb.  The solution will flow into your nostril and out the other, some may also come out of your mouth.  Repeat this on both sides.  You can do this once daily.  Do not store the solution, mix it up fresh each day.  A commercial solution, called Neomed Solution, can be purchased over the counter without prescription.  You can also use a Netti Pot for irrigation.  These can be purchased at your drug store as well.  Be sure to use distilled or boiled water in these as well and make sure the Netti pot is completely dry between uses.  Over the counter medications can be helpful, and in many cases can completely control allergy symptoms without resorting to more expensive prescription meds.   Antihistamines are the mainstay of allergy treatment.  The newer non-sedating antihistamines are all available over the counter.  These include Allegra, Zyrtec, and Claritin which also can be purchased in their generic forms: fexofenadine, cetirizine, and  Cetirizine.  Combining these meds with a decongestant such as pseudoephedrine or phenylephrine helps with nasal congestion, but decongestants can also cause elevations in blood pressure.  Pseudoephedrine tends to be more effective than phenylephrine.  The older, more sedating antihistamines such as chlorpheniramine, brompheniramine, and diphenhydramine are also very effective, sometimes more so than the newer antihistamines, but with the  price of more sedation.  You should be careful about driving or operating heavy machinery when taking sedating antihistamines, and men with enlarged prostates may experience urinary retention with diphenhydramine.  Naslacrom nasal spray can be very effective for allergy symptoms.  It is available over the counter and has very few side effects.  The dosage is 2 sprays in each nostril twice daily.  It is recommended that you pinch your nose shut for 30 seconds after using it since it is a watery spray and can run out.  It can  be used as long as needed.  There is no risk of dependency.  For people with year round allergies, dust, mould, insect emanations, and pet dander are usually the culprits.  To avoid dust, you need to avoid dust mites which are the main source of allergens in house dust.  Cover your bedding with moisture and mite impervious covers.  These can be purchased at any mattress store.  The modern covers are a little expensive, but not at all uncomfortable. Keeping your house as dry as possible will also help to control dust mites.  Do not use a humidifier and it may help to use a dehumidifier.  Use of a HEPA filter air filter is also a great way to reduce dust and mold exposure.  These units can be purchased commercially.  Make sure to buy one large enough for the room you intend to use it.  Change the filter as per the manufacturer's instructions.  Also, using a HEPA filter vacuum for your carpets is helpful.  There are chemicals that you can sprinkle on your carpet called acaricides that will kill dist mites.  The most commonly used brand is Acarosan.  This can be purchased on line.  It does have to be periodically reapplied.  Wash you pillows and bedsheets regularly in hot water.

## 2014-03-01 NOTE — ED Notes (Signed)
Discussed medication precautions and compliance

## 2014-03-01 NOTE — ED Provider Notes (Signed)
Chief Complaint   Chief Complaint  Patient presents with  . Cough    History of Present Illness   Misty Burke is a 58 year old female who was here a week ago with nasal congestion and cough. She was prescribed a prednisone taper, given Depo-Medrol 80 mg IM, and given Atrovent nasal spray. She's getting better but still not completely well yet. She states she blew a large amount of white mucus. She's had a headache and facial pain. She thinks the prednisone caused a number of side effects. She complains of nervousness, twitching of the left eye, light flashes in the left eye, and some visual distortion. The light flashes and visual distortion have gotten better. She denies any eye pain. She's also had headaches, felt dizziness, and some left temporal pain, paroxysmal cough, heart palpitations, generalized weakness, tremulousness, fatigue, and belching. She denies any fever or chills.  Review of Systems   Other than as noted above, the patient denies any of the following symptoms: Systemic:  No fevers, chills, sweats, or myalgias. Eye:  No redness or discharge. ENT:  No ear pain, headache, nasal congestion, drainage, sinus pressure, or sore throat. Neck:  No neck pain, stiffness, or swollen glands. Lungs:  No cough, sputum production, hemoptysis, wheezing, chest tightness, shortness of breath or chest pain. GI:  No abdominal pain, nausea, vomiting or diarrhea.  PMFSH   Past medical history, family history, social history, meds, and allergies were reviewed.   Physical exam   Vital signs:  BP 112/69  Pulse 100  Temp(Src) 98.2 F (36.8 C) (Oral)  SpO2 100% General:  Alert and oriented.  In no distress.  Skin warm and dry. Eye:  No conjunctival injection or drainage. Lids were normal. PERRLA, full EOMs, pupils were small and pinpoint, and unable to see fundi. ENT:  TMs and canals were normal, without erythema or inflammation.  Nasal mucosa was congested and, without drainage.  Mucous  membranes were moist.  Pharynx was clear with no exudate or drainage.  There were no oral ulcerations or lesions. Neck:  Supple, no adenopathy, tenderness or mass. Lungs:  No respiratory distress.  Lungs were clear to auscultation, without wheezes, rales or rhonchi.  Breath sounds were clear and equal bilaterally.  Heart:  Regular rhythm, without gallops, murmers or rubs. Abdomen: Soft, nontender, no organomegaly or mass. Skin:  Clear, warm, and dry, without rash or lesions.  Labs   Results for orders placed during the hospital encounter of 03/01/14  POCT I-STAT, CHEM 8      Result Value Ref Range   Sodium 137  137 - 147 mEq/L   Potassium 4.1  3.7 - 5.3 mEq/L   Chloride 104  96 - 112 mEq/L   BUN 17  6 - 23 mg/dL   Creatinine, Ser 1.611.00  0.50 - 1.10 mg/dL   Glucose, Bld 88  70 - 99 mg/dL   Calcium, Ion 0.961.16  0.451.12 - 1.23 mmol/L   TCO2 26  0 - 100 mmol/L   Hemoglobin 14.3  12.0 - 15.0 g/dL   HCT 40.942.0  81.136.0 - 91.446.0 %     Radiology   Dg Chest 2 View  02/20/2014   CLINICAL DATA:  Cough and congestion and chest tightness for 4 weeks  EXAM: CHEST  2 VIEW  COMPARISON:  None.  FINDINGS: There is mild elevation of the right hemidiaphragm. Anteriorly. The lungs are well-expanded and clear. There is no focal infiltrate. The heart and pulmonary vascularity are normal. There is  no pleural effusion. The bony thorax is unremarkable.  IMPRESSION: There is no acute cardiopulmonary abnormality.   Electronically Signed   By: Uzma Hellmer  [place]   On: 02/20/2014 16:44   Assessment     The primary encounter diagnosis was Cough. Diagnoses of Acute sinusitis, recurrence not specified, unspecified location, Acute bronchitis, unspecified organism, and Photopsia were also pertinent to this visit.  Her respiratory symptoms may be a combination of allergy and infection. In addition she may have had some intolerance to the prednisone. I can't explain the flashing and visual distortion that she saw in her left thigh, but  it has gotten better, so I don't think it's likely to be a retinal detachment. She does need to see an ophthalmologist in the short-term.  Plan    1.  Meds:  The following meds were prescribed:   New Prescriptions   ALBUTEROL (PROVENTIL HFA;VENTOLIN HFA) 108 (90 BASE) MCG/ACT INHALER    Inhale 2 puffs into the lungs 4 (four) times daily.   AMOXICILLIN (AMOXIL) 500 MG CAPSULE    Take 2 capsules (1,000 mg total) by mouth 3 (three) times daily.   CHLORPHENIRAMINE-HYDROCODONE (TUSSIONEX) 10-8 MG/5ML LQCR    Take 5 mLs by mouth every 12 (twelve) hours as needed for cough.   FLUTICASONE (FLONASE) 50 MCG/ACT NASAL SPRAY    Place 2 sprays into both nostrils daily.    2.  Patient Education/Counseling:  The patient was given appropriate handouts, self care instructions, and instructed in symptomatic relief.  Instructed to get extra fluids, rest, and use a cool mist vaporizer.    3.  Follow up:  The patient was told to follow up here if no better in 3 to 4 days, or sooner if becoming worse in any way, and given some red flag symptoms such as increasing fever, difficulty breathing, chest pain, or persistent vomiting which would prompt immediate return.  Follow up with ophthalmology within the next 2 days. She was given the name of Dr. Shade Flood in [place] to followup with.      Reuben Likes, MD 03/01/14 [phone]

## 2014-03-01 NOTE — ED Notes (Signed)
C/o cough, congestion, , reportedly having copious amts of thick white secretions, cough worse at night and just wont go away

## 2014-03-02 ENCOUNTER — Telehealth (HOSPITAL_COMMUNITY): Payer: Self-pay | Admitting: *Deleted

## 2014-03-02 NOTE — ED Notes (Signed)
Pt. called on VM 8/5 and said she was seen for bronchitis and is on Prednisone.  She said she was seeing flashing light in her L eye and had a thumping in her eye, and her cough is causing a headache. She wants to know if this is normal.  I called pt. back today. She said she came in yesterday and was rechecked.  They gave her a referral to an eye doctor. I told her she did the right thing to come back.  She thanked me for calling. Vassie MoselleYork, Ophie Burrowes M 03/02/2014

## 2014-03-31 ENCOUNTER — Emergency Department (INDEPENDENT_AMBULATORY_CARE_PROVIDER_SITE_OTHER)
Admission: EM | Admit: 2014-03-31 | Discharge: 2014-03-31 | Disposition: A | Discharge status: Home / Self Care | Payer: Managed Care, Other (non HMO) | Source: Home / Self Care | Attending: Family Medicine | Admitting: Family Medicine

## 2014-03-31 ENCOUNTER — Encounter (HOSPITAL_COMMUNITY): Payer: Self-pay | Admitting: Emergency Medicine

## 2014-03-31 DIAGNOSIS — R059 Cough, unspecified: Secondary | ICD-10-CM

## 2014-03-31 DIAGNOSIS — R058 Other specified cough: Secondary | ICD-10-CM

## 2014-03-31 DIAGNOSIS — R05 Cough: Secondary | ICD-10-CM

## 2014-03-31 MED ORDER — OMEPRAZOLE 40 MG PO CPDR: 40.0000 mg | DELAYED_RELEASE_CAPSULE | Freq: Every day | ORAL | Status: DC

## 2014-03-31 MED ORDER — FLUTICASONE PROPIONATE 50 MCG/ACT NA SUSP: 2.0000 | Freq: Every day | NASAL | Status: DC

## 2014-03-31 NOTE — Discharge Instructions (Signed)
Thank you for coming in today. Use Flonase as directed for at least one month Take omeprazole daily Followup with a primary care Dr. Call or go to the emergency room if you get worse, have trouble breathing, have chest pains, or palpitations.    Cough, Adult  A cough is a reflex that helps clear your throat and airways. It can help heal the body or may be a reaction to an irritated airway. A cough may only last 2 or 3 weeks (acute) or may last more than 8 weeks (chronic).  CAUSES Acute cough:  Viral or bacterial infections. Chronic cough:  Infections.  Allergies.  Asthma.  Post-nasal drip.  Smoking.  Heartburn or acid reflux.  Some medicines.  Chronic lung problems (COPD).  Cancer. SYMPTOMS   Cough.  Fever.  Chest pain.  Increased breathing rate.  High-pitched whistling sound when breathing (wheezing).  Colored mucus that you cough up (sputum). TREATMENT   A bacterial cough may be treated with antibiotic medicine.  A viral cough must run its course and will not respond to antibiotics.  Your caregiver may recommend other treatments if you have a chronic cough. HOME CARE INSTRUCTIONS   Only take over-the-counter or prescription medicines for pain, discomfort, or fever as directed by your caregiver. Use cough suppressants only as directed by your caregiver.  Use a cold steam vaporizer or humidifier in your bedroom or home to help loosen secretions.  Sleep in a semi-upright position if your cough is worse at night.  Rest as needed.  Stop smoking if you smoke. SEEK IMMEDIATE MEDICAL CARE IF:   You have pus in your sputum.  Your cough starts to worsen.  You cannot control your cough with suppressants and are losing sleep.  You begin coughing up blood.  You have difficulty breathing.  You develop pain which is getting worse or is uncontrolled with medicine.  You have a fever. MAKE SURE YOU:   Understand these instructions.  Will watch your  condition.  Will get help right away if you are not doing well or get worse. Document Released: 01/10/2011 Document Revised: 10/06/2011 Document Reviewed: 01/10/2011 [facility] Patient Information 2015 [place], [place]. This information is not intended to replace advice given to you by your health care provider. Make sure you discuss any questions you have with your health care provider.   PRIMARY CARE Merchant navy officer at [employer] [address]  [place], [place] [phone]  Fax [phone]  Nature conservation officer at [facility] [address]  [place], [place] [phone]  Fax [phone]  Nature conservation officer at [place] / [place] [phone] [place], [place] [phone]  Fax [phone]  [facility] at [facility] [address]  [place], [place] [phone]  Fax [phone]  [facility] [address] [place] [address] [place], [place] [phone]  Fax [phone]  [facility] at [facility] [address] [place], [place] [phone]  Fax [phone]   [facility] @ [place] [address] [place] [address] Phone: [phone]   [facility] Medicine @ [facility] [address] [place] [address] Phone: [phone]   [facility] @ [place] [address] [place] [address] Phone: [phone]   [facility] Medicine @ [place] [address] [place] [address] Phone: [phone]   [facility] Medicine @ [place] [address] [place] [address] Phone: [phone] Fax: [phone]   [facility] @ [place] [address] [place] [address] Phone: [phone]

## 2014-03-31 NOTE — ED Provider Notes (Signed)
Misty Burke is a 58 y.o. female who presents to Urgent Care today for cough. Patient was originally seen in late July and early August with cough. She was treated with Medrol Dosepak, Atrovent nasal spray as well as amoxicillin and Flonase nasal spray. She's feeling much better but notes that the cough is still persistent. The cough is predominantly at night and dry. She denies any significant shortness of breath. She notes that she was unable to tolerate the amoxicillin as it made her very fatigued. She continues to intermittently take 500 mg of amoxicillin daily. She additionally would like a note excusing her from court. She missed two dates in August. She attributes the two missed dates to fatigue secondary to amoxicillin. She states that she was unable to travel to [place] because of the fatigue.   History reviewed. No pertinent past medical history. History  Substance Use Topics  . Smoking status: Never Smoker   . Smokeless tobacco: Not on file  . Alcohol Use: No   ROS as above Medications: No current facility-administered medications for this encounter.   Current Outpatient Prescriptions  Medication Sig Dispense Refill  . albuterol (PROVENTIL HFA;VENTOLIN HFA) 108 (90 BASE) MCG/ACT inhaler Inhale 2 puffs into the lungs 4 (four) times daily.  1 Inhaler  0  . fluticasone (FLONASE) 50 MCG/ACT nasal spray Place 2 sprays into both nostrils daily.  16 g  2  . omeprazole (PRILOSEC) 40 MG capsule Take 1 capsule (40 mg total) by mouth daily.  30 capsule  1  . [DISCONTINUED] citalopram (CELEXA) 20 MG tablet Take 1 tablet (20 mg total) by mouth daily.  30 tablet  3  . [DISCONTINUED] ipratropium (ATROVENT) 0.06 % nasal spray Place 2 sprays into both nostrils 4 (four) times daily.  15 mL  1    Exam:  BP 128/78  Pulse 78  Temp(Src) 97.9 F (36.6 C) (Oral)  Resp 14  SpO2 98% Gen: Well NAD, nontoxic appearing HEENT: ,  MMM posterior pharynx with cobblestoning.  Lungs: Normal work of  breathing. CTABL Heart: RRR no MRG Exts: Brisk capillary refill, warm and well perfused.   No results found for this or any previous visit (from the past 24 hour(s)). No results found.  Assessment and Plan: 58 y.o. female with  1) cough: Possible reflux versus allergies. She additionally has a postnasal drip. Plan to continue Flonase nasal spray and add omeprazole. Encourage patient to followup with a primary care Dr. 2) Note: Provided. D/C amoxicillin.  Discussed warning signs or symptoms. Please see discharge instructions. Patient expresses understanding.   This note was created using Conservation officer, historic buildings. Any transcription errors are unintended.    Rodolph Bong, MD 03/31/14 [phone]

## 2014-03-31 NOTE — ED Notes (Signed)
Evaluated by dr Denyse Amass

## 2015-03-14 ENCOUNTER — Emergency Department (INDEPENDENT_AMBULATORY_CARE_PROVIDER_SITE_OTHER)
Admission: EM | Admit: 2015-03-14 | Discharge: 2015-03-14 | Disposition: A | Discharge status: Home / Self Care | Payer: Medicare Other | Source: Home / Self Care | Attending: Family Medicine | Admitting: Family Medicine

## 2015-03-14 ENCOUNTER — Encounter (HOSPITAL_COMMUNITY): Payer: Self-pay | Admitting: Emergency Medicine

## 2015-03-14 ENCOUNTER — Emergency Department (INDEPENDENT_AMBULATORY_CARE_PROVIDER_SITE_OTHER): Payer: Medicare Other

## 2015-03-14 DIAGNOSIS — S90121A Contusion of right lesser toe(s) without damage to nail, initial encounter: Secondary | ICD-10-CM | POA: Diagnosis not present

## 2015-03-14 NOTE — Discharge Instructions (Signed)
You have developed a contusion of your 5th toe. There is no evidence of fracture to this toe or the fourth toe. Please consider using ibuprofen 600 mg every 6 hours as needed for pain and inflammation. This will be most beneficial to you in the first 48 hours. Please apply an ice pack for 30 minutes at a time 2-4 times per day to help with pain and swelling. This also will be most supple T over the next 48 hours. After that point time use heat for additional healing benefit. Please use the postop shoe and apply buddy tape as needed for additional relief. Remember to let pain be her guide with regards to return to your normal physical activity.

## 2015-03-14 NOTE — ED Notes (Signed)
C/o right toe pain States hit toe on a iron chair last night  Did ice and tape foot as tx

## 2015-03-14 NOTE — ED Provider Notes (Signed)
CSN: [id number]     Arrival date & time 03/14/15  1310 History   First MD Initiated Contact with Patient 03/14/15 1410     Chief Complaint  Patient presents with  . Toe Injury   (Consider location/radiation/quality/duration/timing/severity/associated sxs/prior Treatment) HPI  Right fifth toe injury. Patient states that last night she is a bleeding through the kitchen and ran her right fifth toe into a iron leg on a piece of furniture. Medially painful. Started to swell almost immediately. Difficult to walk on and walking makes it worse. Patient took 2 full dose aspirin without much relief. Elevation with some relief. Pain is constant. Nonradiating. Not getting better or worse.    History reviewed. No pertinent past medical history. History reviewed. No pertinent past surgical history. Family History  Problem Relation Age of Onset  . Cancer Other   . Diabetes Neg Hx   . Hypertension Neg Hx    Social History  Substance Use Topics  . Smoking status: Never Smoker   . Smokeless tobacco: None  . Alcohol Use: No   OB History    No data available     Review of Systems Per HPI with all other pertinent systems negative.   Allergies  Review of patient's allergies indicates no known allergies.  Home Medications   Prior to Admission medications   Medication Sig Start Date End Date Taking? Authorizing Provider  albuterol (PROVENTIL HFA;VENTOLIN HFA) 108 (90 BASE) MCG/ACT inhaler Inhale 2 puffs into the lungs 4 (four) times daily. 03/01/14   Reuben Likes, MD  fluticasone (FLONASE) 50 MCG/ACT nasal spray Place 2 sprays into both nostrils daily. 03/31/14   Rodolph Bong, MD  omeprazole (PRILOSEC) 40 MG capsule Take 1 capsule (40 mg total) by mouth daily. 03/31/14   Rodolph Bong, MD   BP 129/82 mmHg  Pulse 62  Temp(Src) 97.4 F (36.3 C) (Oral)  Resp 16  SpO2 100% Physical Exam Physical Exam  Constitutional: oriented to person, place, and time. appears well-developed and well-nourished.  No distress.  HENT:  Head: Normocephalic and atraumatic.  Eyes: EOMI. PERRL.  Neck: Normal range of motion.  Cardiovascular: RRR, no m/r/g, 2+ distal pulses,  Pulmonary/Chest: Effort normal and breath sounds normal. No respiratory distress.  Abdominal: Soft. Bowel sounds are normal. NonTTP, no distension.  Musculoskeletal: Right fifth toe  Neurological: alert and oriented to person, place, and time.  Skin: Skin is warm. No rash noted. non diaphoretic.  Psychiatric: normal mood and affect. behavior is normal. Judgment and thought content normal.   ED Course  Procedures (including critical care time) Labs Review Labs Reviewed - No data to display  Imaging Review Dg Foot Complete Right  03/14/2015   CLINICAL DATA:  Jammed right foot into metal chair. Right fourth and fifth toe pain. Prior fracture to the right fourth toe in 2014  EXAM: RIGHT FOOT COMPLETE - 3+ VIEW  COMPARISON:  None.  FINDINGS: There is no evidence of fracture or dislocation. There is no evidence of arthropathy or other focal bone abnormality. Soft tissues are unremarkable.  IMPRESSION: Negative.   Electronically Signed   By: Charlett Nose M.D.   On: 03/14/2015 14:50     MDM   1. Toe contusion, right, initial encounter    X-ray negative for fracture. Buddy tape and postop shoe applied. Recommending ice, basic range of motion exercises, NSAIDs. Follow-up in 2 weeks if not improved with PCP.   Ozella Rocks, MD 03/14/15 [phone]

## 2015-09-12 ENCOUNTER — Emergency Department (INDEPENDENT_AMBULATORY_CARE_PROVIDER_SITE_OTHER)
Admission: EM | Admit: 2015-09-12 | Discharge: 2015-09-12 | Disposition: A | Discharge status: Home / Self Care | Payer: Medicare Other | Source: Home / Self Care | Attending: Family Medicine | Admitting: Family Medicine

## 2015-09-12 ENCOUNTER — Encounter (HOSPITAL_COMMUNITY): Payer: Self-pay | Admitting: *Deleted

## 2015-09-12 DIAGNOSIS — M7662 Achilles tendinitis, left leg: Secondary | ICD-10-CM | POA: Diagnosis not present

## 2015-09-12 MED ORDER — MELOXICAM 7.5 MG PO TABS: 7.5000 mg | ORAL_TABLET | Freq: Two times a day (BID) | ORAL | Status: DC

## 2015-09-12 NOTE — ED Notes (Addendum)
Pt  Reports  l  Heel  Pain  X  4   Weeks    -  He  denys  Any  specefic  Injury        SHE   REPORTS   PAIN  WHEN  SHE BEARS  WEIGHT      She  Is  Sitting  Upright  On  The  Exam table   Speaking  In   Complete   sentances   An  Is  No  Acute  Distress    At  This  Time

## 2015-09-12 NOTE — Discharge Instructions (Signed)
Wear boot whenever walking, use ice and medicine , see orthopedist on fri am at 8:30 for recheck.

## 2015-10-12 NOTE — ED Provider Notes (Signed)
CSN: [id number]     Arrival date & time 09/12/15  1433 History   First MD Initiated Contact with Patient 09/12/15 1531     Chief Complaint  Patient presents with  . Foot Pain   (Consider location/radiation/quality/duration/timing/severity/associated sxs/prior Treatment) HPI  History reviewed. No pertinent past medical history. History reviewed. No pertinent past surgical history. Family History  Problem Relation Age of Onset  . Cancer Other   . Diabetes Neg Hx   . Hypertension Neg Hx    Social History  Substance Use Topics  . Smoking status: Never Smoker   . Smokeless tobacco: None  . Alcohol Use: No   OB History    No data available     Review of Systems  Allergies  Review of patient's allergies indicates no known allergies.  Home Medications   Prior to Admission medications   Medication Sig Start Date End Date Taking? Authorizing Provider  albuterol (PROVENTIL HFA;VENTOLIN HFA) 108 (90 BASE) MCG/ACT inhaler Inhale 2 puffs into the lungs 4 (four) times daily. 03/01/14   Reuben Likesavid C Keller, MD  fluticasone (FLONASE) 50 MCG/ACT nasal spray Place 2 sprays into both nostrils daily. 03/31/14   Rodolph BongEvan S Corey, MD  meloxicam (MOBIC) 7.5 MG tablet Take 1 tablet (7.5 mg total) by mouth 2 (two) times daily after a meal. 09/12/15   Linna HoffJames D Thelda Gagan, MD  omeprazole (PRILOSEC) 40 MG capsule Take 1 capsule (40 mg total) by mouth daily. 03/31/14   Rodolph BongEvan S Corey, MD   Meds Ordered and Administered this Visit  Medications - No data to display  BP 118/79 mmHg  Pulse 69  Temp(Src) 97.4 F (36.3 C) (Oral)  Resp 16  SpO2 98% No data found.   Physical Exam  ED Course  Procedures (including critical care time)  Labs Review Labs Reviewed - No data to display  Imaging Review No results found.   Visual Acuity Review  Right Eye Distance:   Left Eye Distance:   Bilateral Distance:    Right Eye Near:   Left Eye Near:    Bilateral Near:         MDM   1. Tendonitis, Achilles, left         Linna HoffJames D Tyrick Dunagan, MD 10/12/15 2129

## 2016-04-29 ENCOUNTER — Encounter (HOSPITAL_COMMUNITY): Payer: Self-pay | Admitting: Family Medicine

## 2016-04-29 ENCOUNTER — Ambulatory Visit (HOSPITAL_COMMUNITY)
Admission: EM | Admit: 2016-04-29 | Discharge: 2016-04-29 | Disposition: A | Discharge status: Home / Self Care | Payer: Medicare Other | Attending: Family Medicine | Admitting: Family Medicine

## 2016-04-29 DIAGNOSIS — S61221A Laceration with foreign body of left index finger without damage to nail, initial encounter: Secondary | ICD-10-CM

## 2016-04-29 DIAGNOSIS — S61223A Laceration with foreign body of left middle finger without damage to nail, initial encounter: Secondary | ICD-10-CM | POA: Diagnosis not present

## 2016-04-29 MED ORDER — CEPHALEXIN 500 MG PO CAPS: 500.0000 mg | ORAL_CAPSULE | Freq: Four times a day (QID) | ORAL | 0 refills | Status: DC

## 2016-04-29 MED ORDER — TETANUS-DIPHTH-ACELL PERTUSSIS 5-2.5-18.5 LF-MCG/0.5 IM SUSP: 0.5000 mL | Freq: Once | INTRAMUSCULAR | Status: DC

## 2016-04-29 NOTE — ED Triage Notes (Signed)
Pt here for laceration to left middle finger and index finger. sts she cut with a pair of hedge clippers.

## 2016-04-29 NOTE — ED Provider Notes (Signed)
CSN: [id number]     Arrival date & time 04/29/16  1824 History   First MD Initiated Contact with Patient 04/29/16 1907     Chief Complaint  Patient presents with  . Finger Injury   (Consider location/radiation/quality/duration/timing/severity/associated sxs/prior Treatment) Holding hedge trimmers with left hand as she turned them on. Produced superficial lacerations to the left index and long fingers flexure surface. The patient states that the finger started bleeding in order to stop the bleeding she grabbed a handful of dirt and packed the wound with dirt. She later started washing the hands with a garden hose. Uncertain about last tetanus.      History reviewed. No pertinent past medical history. History reviewed. No pertinent surgical history. Family History  Problem Relation Age of Onset  . Cancer Other   . Diabetes Neg Hx   . Hypertension Neg Hx    Social History  Substance Use Topics  . Smoking status: Never Smoker  . Smokeless tobacco: Never Used  . Alcohol use No   OB History    No data available     Review of Systems  Constitutional: Negative.   HENT: Negative.   Respiratory: Negative.   Musculoskeletal: Negative.   Skin: Positive for wound.  Neurological: Positive for dizziness.  All other systems reviewed and are negative.   Allergies  Review of patient's allergies indicates no known allergies.  Home Medications   Prior to Admission medications   Medication Sig Start Date End Date Taking? Authorizing Provider  albuterol (PROVENTIL HFA;VENTOLIN HFA) 108 (90 BASE) MCG/ACT inhaler Inhale 2 puffs into the lungs 4 (four) times daily. 03/01/14   Reuben Likesavid C Keller, MD  cephALEXin (KEFLEX) 500 MG capsule Take 1 capsule (500 mg total) by mouth 4 (four) times daily. 04/29/16   Hayden Rasmussenavid Arielle Eber, NP  fluticasone (FLONASE) 50 MCG/ACT nasal spray Place 2 sprays into both nostrils daily. 03/31/14   Rodolph BongEvan S Corey, MD  meloxicam (MOBIC) 7.5 MG tablet Take 1 tablet (7.5 mg total) by  mouth 2 (two) times daily after a meal. 09/12/15   Linna HoffJames D Kindl, MD  omeprazole (PRILOSEC) 40 MG capsule Take 1 capsule (40 mg total) by mouth daily. 03/31/14   Rodolph BongEvan S Corey, MD   Meds Ordered and Administered this Visit   Medications  Tdap (BOOSTRIX) injection 0.5 mL (not administered)    There were no vitals taken for this visit. No data found.   Physical Exam  Constitutional: She is oriented to person, place, and time. She appears well-developed and well-nourished. No distress.  Neck: Neck supple.  Cardiovascular: Normal rate.   Pulmonary/Chest: Effort normal.  Musculoskeletal: Normal range of motion. She exhibits no edema.  Froehlich digits involved of the left hand patient exhibits flexion against resistance with the index middle and ring fingers. Extension against resistance is intact. No evidence of tendon injury. There are 2 wounds to the flexor surfaces of the left index finger. The wound over the proximal phalanx is incision like measuring approximately 4 mm in length and involves the dermis only. Distal phalynx laceration curvilinear appox 1.2cm jagged and involves dermis and superficial sub q layer.  Left distal phalynx long finger with jagged curvilinear laceration approc 2 cm, edges chewed into several smaller flaps, Includes dermis and deeper sub Q but no tendon or muscle involvement.  Neurological: She is alert and oriented to person, place, and time.  Nursing note and vitals reviewed.   Urgent Care Course   Clinical Course    .Marland Kitchen.Laceration Repair Date/Time:  04/29/2016 9:21 PM Performed by: Phineas Real, Altagracia Rone Authorized by: Bradd Canary D   Consent:    Consent obtained:  Verbal   Consent given by:  Patient   Risks discussed:  Infection, pain, retained foreign body, need for additional repair and poor wound healing Anesthesia (see MAR for exact dosages):    Anesthesia method:  Local infiltration   Local anesthetic:  Lidocaine 2% w/o epi Laceration details:     Location:  Finger   Finger location:  L long finger   Length (cm):  2 Repair type:    Repair type:  Intermediate Pre-procedure details:    Preparation:  Patient was prepped and draped in usual sterile fashion Exploration:    Hemostasis achieved with:  Direct pressure and tied off vessels   Wound exploration: entire depth of wound probed and visualized     Wound extent: foreign bodies/material     Wound extent: no fascia violation noted, no muscle damage noted, no tendon damage noted and no underlying fracture noted     Contaminated: yes   Treatment:    Area cleansed with:  Saline, soap and water and Betadine   Amount of cleaning:  Extensive   Irrigation solution:  Sterile saline   Irrigation volume:  500   Irrigation method:  Syringe   Visualized foreign bodies/material removed: yes   Skin repair:    Repair method:  Sutures   Suture size:  4-0   Suture material:  Prolene   Suture technique:  Simple interrupted   Number of sutures:  5 Approximation:    Approximation:  Close   Vermilion border: well-aligned   Post-procedure details:    Dressing:  Antibiotic ointment and bulky dressing   Patient tolerance of procedure:  Tolerated well, no immediate complications    (including critical care time)  Labs Review Labs Reviewed - No data to display  Imaging Review No results found. Left index finger laceration of the distal phalanx prepped and draped in sterile fashion. Copiously irrigated and abraded and all foreign bodies seen removed. The flap was then tacked down with 2 Prolene sutures.  Visual Acuity Review  Right Eye Distance:   Left Eye Distance:   Bilateral Distance:    Right Eye Near:   Left Eye Near:    Bilateral Near:         MDM   1. Laceration of left index finger with foreign body without damage to nail, initial encounter   2. Laceration of left middle finger with foreign body without damage to nail, initial encounter    Keep the wounds clean and  dry. Change dressing tomorrow night. Wash with one half peroxide one half water. Then dry well and apply Neosporin ointment. He may apply this appointment for the next 48 hours then no more. Watch for any signs of infection. Return instructions for the signs has really discussed and that are written. Sutures out and 9 days. For any worsening new symptoms or problems return promptly. Meds ordered this encounter  Medications  . cephALEXin (KEFLEX) 500 MG capsule    Sig: Take 1 capsule (500 mg total) by mouth 4 (four) times daily.    Dispense:  15 capsule    Refill:  0    Order Specific Question:   Supervising Provider    Answer:   Linna Hoff [phone]  . Tdap (BOOSTRIX) injection 0.5 mL   Sterile dressing     Hayden Rasmussen, NP 04/29/16 2126

## 2016-04-29 NOTE — Discharge Instructions (Signed)
Keep the wounds clean and dry. Change dressing tomorrow night. Wash with one half peroxide one half water. Then dry well and apply Neosporin ointment. He may apply this appointment for the next 48 hours then no more. Watch for any signs of infection. Return instructions for the signs has really discussed and that are written. Sutures out and 9 days. For any worsening new symptoms or problems return promptly.

## 2016-04-30 NOTE — ED Notes (Signed)
Patient returned on 04/30/2016 to receive TDAP booster. Patient was administered TDAP Boostrix in the left deltoid. NDC# [phone], Lot# [id number] and Exp: 03/13/2018.

## 2016-05-08 ENCOUNTER — Encounter (HOSPITAL_COMMUNITY): Payer: Self-pay | Admitting: Family Medicine

## 2016-05-08 ENCOUNTER — Ambulatory Visit (HOSPITAL_COMMUNITY)
Admission: EM | Admit: 2016-05-08 | Discharge: 2016-05-08 | Disposition: A | Discharge status: Home / Self Care | Payer: Medicare Other | Attending: Internal Medicine | Admitting: Internal Medicine

## 2016-05-08 DIAGNOSIS — Z4802 Encounter for removal of sutures: Secondary | ICD-10-CM

## 2016-05-08 MED ORDER — BACITRACIN ZINC 500 UNIT/GM EX OINT
TOPICAL_OINTMENT | CUTANEOUS | Status: AC
  Filled 2016-05-08: qty 9

## 2016-05-08 NOTE — ED Provider Notes (Signed)
CSN: [id number]     Arrival date & time 05/08/16  1811 History   None    Chief Complaint  Patient presents with  . Suture / Staple Removal   (Consider location/radiation/quality/duration/timing/severity/associated sxs/prior Treatment) Patient is here for suture removal from right index and middle finger lacerations.  She is on day 9 since having sutures placed.   The history is provided by the patient.  Suture / Staple Removal  This is a new problem. The current episode started more than 1 week ago. The problem occurs constantly. The problem has not changed since onset.Nothing aggravates the symptoms. Nothing relieves the symptoms. She has tried nothing for the symptoms.    History reviewed. No pertinent past medical history. History reviewed. No pertinent surgical history. Family History  Problem Relation Age of Onset  . Cancer Other   . Diabetes Neg Hx   . Hypertension Neg Hx    Social History  Substance Use Topics  . Smoking status: Never Smoker  . Smokeless tobacco: Never Used  . Alcohol use No   OB History    No data available     Review of Systems  Constitutional: Negative.   HENT: Negative.   Eyes: Negative.   Respiratory: Negative.   Cardiovascular: Negative.   Gastrointestinal: Negative.   Endocrine: Negative.   Genitourinary: Negative.   Musculoskeletal: Negative.   Skin: Positive for wound.  Allergic/Immunologic: Negative.   Neurological: Negative.   Hematological: Negative.   Psychiatric/Behavioral: Negative.     Allergies  Review of patient's allergies indicates no known allergies.  Home Medications   Prior to Admission medications   Medication Sig Start Date End Date Taking? Authorizing Provider  albuterol (PROVENTIL HFA;VENTOLIN HFA) 108 (90 BASE) MCG/ACT inhaler Inhale 2 puffs into the lungs 4 (four) times daily. 03/01/14   Reuben Likesavid C Keller, MD  cephALEXin (KEFLEX) 500 MG capsule Take 1 capsule (500 mg total) by mouth 4 (four) times daily.  04/29/16   Hayden Rasmussenavid Mabe, NP  fluticasone (FLONASE) 50 MCG/ACT nasal spray Place 2 sprays into both nostrils daily. 03/31/14   Rodolph BongEvan S Corey, MD  meloxicam (MOBIC) 7.5 MG tablet Take 1 tablet (7.5 mg total) by mouth 2 (two) times daily after a meal. 09/12/15   Linna HoffJames D Kindl, MD  omeprazole (PRILOSEC) 40 MG capsule Take 1 capsule (40 mg total) by mouth daily. 03/31/14   Rodolph BongEvan S Corey, MD   Meds Ordered and Administered this Visit  Medications - No data to display  BP 124/74   Pulse 74   Temp 98.4 F (36.9 C)   Resp 18   SpO2 98%  No data found.   Physical Exam  Constitutional: She appears well-developed and well-nourished.  HENT:  Head: Normocephalic and atraumatic.  Eyes: EOM are normal. Pupils are equal, round, and reactive to light.  Neck: Normal range of motion. Neck supple.  Cardiovascular: Normal rate, regular rhythm and normal heart sounds.   Pulmonary/Chest: Effort normal and breath sounds normal.  Abdominal: Soft. Bowel sounds are normal.  Skin:  Sutures well approximate lacerations on right index and middle finger.  Nursing note and vitals reviewed.   Urgent Care Course   Clinical Course    .Suture Removal Date/Time: 05/08/2016 7:15 PM Performed by: Deatra CanterXFORD, Montee Tallman J Authorized by: Eustace MooreMURRAY, LAURA W   Consent:    Consent obtained:  Verbal   Consent given by:  Patient   Risks discussed:  Pain   Alternatives discussed:  No treatment Location:    Location:  Upper extremity   Upper extremity location:  Hand   Hand location:  R index finger and R long finger Procedure details:    Wound appearance:  Good wound healing   Number of sutures removed:  7 Post-procedure details:    Post-removal:  Antibiotic ointment applied and Band-Aid applied   Patient tolerance of procedure:  Tolerated well, no immediate complications   (including critical care time)  Labs Review Labs Reviewed - No data to display  Imaging Review No results found.   Visual Acuity Review  Right  Eye Distance:   Left Eye Distance:   Bilateral Distance:    Right Eye Near:   Left Eye Near:    Bilateral Near:         MDM  Suture removal right middle and index finger Recommend apply bandaid qd to finger lacerations   Follow up prn.    Deatra Canter, FNP 05/08/16 [phone]

## 2016-05-08 NOTE — ED Triage Notes (Signed)
Pt here for suture removal

## 2016-10-08 ENCOUNTER — Ambulatory Visit
Admission: RE | Admit: 2016-10-08 | Discharge: 2016-10-08 | Disposition: A | Discharge status: Home / Self Care | Payer: Medicare Other | Source: Ambulatory Visit | Attending: Internal Medicine | Admitting: Internal Medicine

## 2016-10-08 ENCOUNTER — Other Ambulatory Visit: Payer: Self-pay | Admitting: Internal Medicine

## 2016-10-08 DIAGNOSIS — Z1231 Encounter for screening mammogram for malignant neoplasm of breast: Secondary | ICD-10-CM

## 2016-10-28 ENCOUNTER — Other Ambulatory Visit: Payer: Self-pay | Admitting: Internal Medicine

## 2016-10-28 DIAGNOSIS — R928 Other abnormal and inconclusive findings on diagnostic imaging of breast: Secondary | ICD-10-CM

## 2016-11-03 ENCOUNTER — Other Ambulatory Visit: Payer: Medicare Other

## 2016-11-06 ENCOUNTER — Other Ambulatory Visit: Payer: Medicare Other

## 2016-12-03 ENCOUNTER — Encounter (HOSPITAL_COMMUNITY): Payer: Self-pay | Admitting: Student

## 2016-12-03 ENCOUNTER — Inpatient Hospital Stay (HOSPITAL_COMMUNITY)
Admission: AD | Admit: 2016-12-03 | Discharge: 2016-12-03 | Disposition: A | Discharge status: Home / Self Care | Payer: Medicare Other | Source: Ambulatory Visit | Attending: Obstetrics & Gynecology | Admitting: Obstetrics & Gynecology

## 2016-12-03 DIAGNOSIS — N6320 Unspecified lump in the left breast, unspecified quadrant: Secondary | ICD-10-CM | POA: Diagnosis not present

## 2016-12-03 DIAGNOSIS — Z79899 Other long term (current) drug therapy: Secondary | ICD-10-CM | POA: Insufficient documentation

## 2016-12-03 DIAGNOSIS — N644 Mastodynia: Secondary | ICD-10-CM | POA: Diagnosis not present

## 2016-12-03 DIAGNOSIS — F329 Major depressive disorder, single episode, unspecified: Secondary | ICD-10-CM | POA: Insufficient documentation

## 2016-12-03 HISTORY — DX: Depression, unspecified: F32.A

## 2016-12-03 HISTORY — DX: Major depressive disorder, single episode, unspecified: F32.9

## 2016-12-03 NOTE — MAU Provider Note (Signed)
History     CSN: [id number]  Arrival date and time: 12/03/16 1759  First Provider Initiated Contact with Patient 12/03/16 1818      Chief Complaint  Patient presents with  . Breast Mass  . Breast Pain   HPI Misty Burke is a 61 y.o. female who presents for breast mass. Patient states she had mammogram in March that was abnormal & ultrasound was recommended. States they couldn't do the ultrasound b/c her PCP (from out of town) wouldn't sign the order as she hadn't been seen in the office since 2016. Patient states she has been in [place] since 2015-03-21 due to her mother's death & has not established PCP b/c she didn't think she'd be here this long.  As of today, has felt a mass in her left breast that is tender. Denies fever/chills, weight loss, or nipple discharge.   Past Medical History:  Diagnosis Date  . Depression     Past Surgical History:  Procedure Laterality Date  . NO PAST SURGERIES      Family History  Problem Relation Age of Onset  . Cancer Other   . Diabetes Neg Hx   . Hypertension Neg Hx     Social History  Substance Use Topics  . Smoking status: Never Smoker  . Smokeless tobacco: Never Used  . Alcohol use No    Allergies: No Known Allergies  Prescriptions Prior to Admission  Medication Sig Dispense Refill Last Dose  . albuterol (PROVENTIL HFA;VENTOLIN HFA) 108 (90 BASE) MCG/ACT inhaler Inhale 2 puffs into the lungs 4 (four) times daily. 1 Inhaler 0   . cephALEXin (KEFLEX) 500 MG capsule Take 1 capsule (500 mg total) by mouth 4 (four) times daily. 15 capsule 0   . fluticasone (FLONASE) 50 MCG/ACT nasal spray Place 2 sprays into both nostrils daily. 16 g 2   . meloxicam (MOBIC) 7.5 MG tablet Take 1 tablet (7.5 mg total) by mouth 2 (two) times daily after a meal. 60 tablet 1   . omeprazole (PRILOSEC) 40 MG capsule Take 1 capsule (40 mg total) by mouth daily. 30 capsule 1     Review of Systems  Constitutional: Negative for chills, fever and unexpected  weight change.  Cardiovascular: Negative for chest pain.   Physical Exam   Blood pressure 128/79, pulse 82, temperature 98.4 F (36.9 C), temperature source Oral, resp. rate 18, weight 238 lb 12 oz (108.3 kg), SpO2 98 %.  Physical Exam  Nursing note and vitals reviewed. Constitutional: She is oriented to person, place, and time. She appears well-developed and well-nourished. No distress.  HENT:  Head: Normocephalic and atraumatic.  Eyes: Conjunctivae are normal. Right eye exhibits no discharge. Left eye exhibits no discharge. No scleral icterus.  Neck: Normal range of motion.  Respiratory: Effort normal. No respiratory distress. Right breast exhibits no inverted nipple, no mass, no nipple discharge, no skin change and no tenderness. Left breast exhibits mass and tenderness. Left breast exhibits no inverted nipple, no nipple discharge and no skin change. Breasts are symmetrical.    Neurological: She is alert and oriented to person, place, and time.  Skin: Skin is warm and dry. She is not diaphoretic.  Psychiatric: She has a normal mood and affect. Her behavior is normal. Judgment and thought content normal.    MAU Course  Procedures No results found for this or any previous visit (from the past 24 hour(s)).  MDM VSS, NAD Breast exam performed  Assessment and Plan  A; 1. Left  breast mass    P: Discharge home Left breast ultrasound ordered Pt provided with contact info for breast center Discussed reasons to return to MAU Establish care with PCP  Judeth HornErin Rogenia Werntz 12/03/2016, 6:18 PM

## 2016-12-03 NOTE — Discharge Instructions (Signed)
Breast Ultrasound Breast ultrasound, or breast ultrasonogram, is a procedure that is used to check for breast problems. A breast ultrasound uses harmless sound waves and a computer to create pictures of the inside of your breast. This procedure can help determine whether a lump in your breast is a fluid-filled sac (cyst), a solid tumor, or a growth. It can also help determine whether a tumor is cancerous (malignant) or noncancerous (benign). Sometimes a breast ultrasound is done to locate nodules in the breast that are too small to feel but need to be removed during surgery. A breast ultrasound takes about 30 minutes. Tell a health care provider about:  Any allergies you have.  All medicines you are taking, including vitamins, herbs, eye drops, creams, and over-the-counter medicines.  Any blood disorders you have.  Any surgeries you have had.  Any medical conditions you have. What are the risks? Breast ultrasound is safe and painless. Ultrasound imaging does not use X-rays. There are no known risks for this procedure. What happens before the procedure? You do not need to prepare for a breast ultrasound. You will undress from your waist up, so wear loose, comfortable clothing on the day of the procedure. What happens during the procedure?  You will lie down on an examining table.  A health care provider will apply gel to your breast area.  You may be asked to hold your arm above your head.  The health care provider will move a handheld probe, which looks like a microphone, back and forth over your breast.  The probe will send signals to a computer that will create images of your breast.  When the procedure is over, the gel will be cleaned from your breast, and you can get dressed. What happens after the procedure?  You can go home right away and do all of your usual activities.  It is up to you to get the results of your procedure. Ask your health care provider, or the department  that is doing the procedure, when your results will be ready. Summary  A breast ultrasound is a procedure that is used to check for breast problems.  This procedure uses harmless sound waves and a computer to create pictures of the inside of your breast.  This is a safe procedure. This information is not intended to replace advice given to you by your health care provider. Make sure you discuss any questions you have with your health care provider. Document Released: 08/05/2004 Document Revised: 03/20/2016 Document Reviewed: 03/17/2016 Elsevier Interactive Patient Education  2017 [employer]  

## 2016-12-03 NOTE — MAU Note (Addendum)
Had a mammogram, they found something in the left breast.    Needed a referral ? [place].  Today she actually felt something in her left breast and it hurts, lump does move around.   Has not had US, or biopsy.

## 2016-12-05 ENCOUNTER — Other Ambulatory Visit (HOSPITAL_COMMUNITY): Payer: Self-pay | Admitting: Student

## 2016-12-05 ENCOUNTER — Ambulatory Visit
Admission: RE | Admit: 2016-12-05 | Discharge: 2016-12-05 | Disposition: A | Discharge status: Home / Self Care | Payer: Medicare Other | Source: Ambulatory Visit | Attending: Student | Admitting: Student

## 2016-12-05 DIAGNOSIS — N6320 Unspecified lump in the left breast, unspecified quadrant: Secondary | ICD-10-CM

## 2016-12-05 DIAGNOSIS — N6002 Solitary cyst of left breast: Secondary | ICD-10-CM

## 2016-12-10 ENCOUNTER — Ambulatory Visit
Admission: RE | Admit: 2016-12-10 | Discharge: 2016-12-10 | Disposition: A | Discharge status: Home / Self Care | Payer: Medicare Other | Source: Ambulatory Visit | Attending: Student | Admitting: Student

## 2016-12-10 DIAGNOSIS — N6002 Solitary cyst of left breast: Secondary | ICD-10-CM

## 2016-12-24 ENCOUNTER — Encounter: Payer: Self-pay | Admitting: Physician Assistant

## 2016-12-24 ENCOUNTER — Ambulatory Visit (INDEPENDENT_AMBULATORY_CARE_PROVIDER_SITE_OTHER): Payer: Medicare Other | Admitting: Physician Assistant

## 2016-12-24 ENCOUNTER — Ambulatory Visit (INDEPENDENT_AMBULATORY_CARE_PROVIDER_SITE_OTHER): Payer: Medicare Other

## 2016-12-24 ENCOUNTER — Encounter (HOSPITAL_COMMUNITY): Payer: Self-pay | Admitting: *Deleted

## 2016-12-24 ENCOUNTER — Ambulatory Visit (HOSPITAL_COMMUNITY)
Admission: EM | Admit: 2016-12-24 | Discharge: 2016-12-24 | Disposition: A | Discharge status: Home / Self Care | Payer: Medicare Other

## 2016-12-24 VITALS — BP 130/81 | HR 63 | Temp 97.3°F | Resp 18 | Ht 69.84 in | Wt 241.8 lb

## 2016-12-24 DIAGNOSIS — R609 Edema, unspecified: Secondary | ICD-10-CM

## 2016-12-24 DIAGNOSIS — F4321 Adjustment disorder with depressed mood: Secondary | ICD-10-CM

## 2016-12-24 DIAGNOSIS — R600 Localized edema: Secondary | ICD-10-CM | POA: Diagnosis present

## 2016-12-24 DIAGNOSIS — F4381 Prolonged grief disorder: Secondary | ICD-10-CM

## 2016-12-24 DIAGNOSIS — R635 Abnormal weight gain: Secondary | ICD-10-CM | POA: Diagnosis not present

## 2016-12-24 DIAGNOSIS — I872 Venous insufficiency (chronic) (peripheral): Secondary | ICD-10-CM | POA: Diagnosis present

## 2016-12-24 LAB — POCT CBC
GRANULOCYTE PERCENT: 66.5 % (ref 37–80)
HCT, POC: 39.1 % (ref 37.7–47.9)
Hemoglobin: 13.2 g/dL (ref 12.2–16.2)
LYMPH, POC: 2.3 (ref 0.6–3.4)
MCH, POC: 28.6 pg (ref 27–31.2)
MCHC: 33.7 g/dL (ref 31.8–35.4)
MCV: 84.8 fL (ref 80–97)
MID (cbc): 0.5 (ref 0–0.9)
MPV: 10.6 fL (ref 0–99.8)
POC Granulocyte: 5.7 (ref 2–6.9)
POC LYMPH %: 27.1 % (ref 10–50)
POC MID %: 6.4 %M (ref 0–12)
Platelet Count, POC: 204 10*3/uL (ref 142–424)
RBC: 4.61 M/uL (ref 4.04–5.48)
RDW, POC: 14.6 %
WBC: 8.5 10*3/uL (ref 4.6–10.2)

## 2016-12-24 LAB — POCT URINALYSIS DIP (MANUAL ENTRY)
BILIRUBIN UA: NEGATIVE
Blood, UA: NEGATIVE
GLUCOSE UA: NEGATIVE mg/dL
Ketones, POC UA: NEGATIVE mg/dL
LEUKOCYTES UA: NEGATIVE
NITRITE UA: NEGATIVE
Protein Ur, POC: NEGATIVE mg/dL
Spec Grav, UA: 1.02 (ref 1.010–1.025)
Urobilinogen, UA: 0.2 E.U./dL
pH, UA: 5.5 (ref 5.0–8.0)

## 2016-12-24 LAB — POCT GLYCOSYLATED HEMOGLOBIN (HGB A1C): Hemoglobin A1C: 5.6

## 2016-12-24 MED ORDER — FUROSEMIDE 20 MG PO TABS: ORAL_TABLET | ORAL | 3 refills | Status: DC

## 2016-12-24 NOTE — Discharge Instructions (Addendum)
Please call or explore [place] website for info on medical providers in your area accepting your insurance-call for appt to get established. Rest,elevate, wear support elastic hosiery(Elastic therapy is an option in [place] or local medical supply company can assist you with this). Go to Er for new or worsening issues such as CP, SOB, or palpitations, etc. Return to UC as needed.

## 2016-12-24 NOTE — ED Triage Notes (Signed)
Patient reports swelling to bilateral feet, 1+ edema noted, x 1 month. Mild pain with swelling. Denies sob or cp.

## 2016-12-24 NOTE — ED Notes (Signed)
Patient requesting more information regarding a PCP, patient given resources to help find PCP. Patient encouraged to get TED hose to help with the swelling.   After further conversation patient has had recent loss in her family and is stressed out. Condolences given to patient.

## 2016-12-24 NOTE — Progress Notes (Signed)
12/24/2016 5:04 PM   DOB: August 22, 1955 / MRN: [id number]  SUBJECTIVE:  Misty Burke is a 61 y.o. female presenting for 1 month of bilateral leg swelling that is getting worse.  She denies SOB, chest pain, but does tell me she has some mildly new DOE.  She denies a history of hypertension, diabetes, CAD, pulmonary disorder.  Denies any difficulty with urination today. Denies exogenous estrogen and she is a never smoker. No drinking more water.  She tells me that she is gained about 60 lbs in the last two years and this has been "steady."  She has tried horeshcestnut for this problem without any changes in the swelling.   Tells me that she lost her mother and has been suffering from depression as a result and thinks she has been suffering from depression since.     Depression screen PHQ 2/9 12/24/2016  Decreased Interest 0  Down, Depressed, Hopeless 0  PHQ - 2 Score 0   She has No Known Allergies.   She  has a past medical history of Depression.    She  reports that she has never smoked. She has never used smokeless tobacco. She reports that she drinks alcohol. She reports that she does not use drugs. She  reports that she does not currently engage in sexual activity. The patient  has a past surgical history that includes No past surgeries.  Her family history includes Cancer in her father and other.  Review of Systems  Constitutional: Negative for diaphoresis and fever.  Respiratory: Negative for cough and wheezing.   Cardiovascular: Positive for leg swelling. Negative for chest pain, orthopnea and PND.  Genitourinary: Negative for dysuria, flank pain, frequency, hematuria and urgency.  Skin: Negative for rash.  Neurological: Negative for dizziness.    The problem list and medications were reviewed and updated by myself where necessary and exist elsewhere in the encounter.   OBJECTIVE:  BP 130/81 (BP Location: Right Arm, Patient Position: Sitting, Cuff Size: Large)   Pulse 63   Temp  97.3 F (36.3 C) (Oral)   Resp 18   Ht 5' 9.84" (1.774 m)   Wt 241 lb 12.8 oz (109.7 kg)   SpO2 98%   BMI 34.85 kg/m   Physical Exam  Constitutional: She is oriented to person, place, and time. She is active.  Non-toxic appearance.  Cardiovascular: Normal rate, regular rhythm, S1 normal, S2 normal, normal heart sounds and intact distal pulses.  Exam reveals no gallop, no friction rub and no decreased pulses.   No murmur heard. Pulmonary/Chest: Effort normal. No tachypnea. She has no rales.  Abdominal: She exhibits no distension.  Musculoskeletal: She exhibits edema (2+ bilateral feet and lower ankles). She exhibits no tenderness or deformity.  Neurological: She is alert and oriented to person, place, and time.  Skin: Skin is warm and dry. She is not diaphoretic. No pallor.  Psychiatric: Her speech is normal and behavior is normal. Judgment and thought content normal. Cognition and memory are normal. She exhibits a depressed mood (tearful as she relays the history of her mother's death).    Results for orders placed or performed in visit on 12/24/16 (from the past 72 hour(s))  POCT urinalysis dipstick     Status: None   Collection Time: 12/24/16  4:26 PM  Result Value Ref Range   Color, UA yellow yellow   Clarity, UA clear clear   Glucose, UA negative negative mg/dL   Bilirubin, UA negative negative   Ketones, POC  UA negative negative mg/dL   Spec Grav, UA 1.020 1.010 - 1.025   Blood, UA negative negative   pH, UA 5.5 5.0 - 8.0   Protein Ur, POC negative negative mg/dL   Urobilinogen, UA 0.2 0.2 or 1.0 E.U./dL   Nitrite, UA Negative Negative   Leukocytes, UA Negative Negative  POCT CBC     Status: None   Collection Time: 12/24/16  4:29 PM  Result Value Ref Range   WBC 8.5 4.6 - 10.2 K/uL   Lymph, poc 2.3 0.6 - 3.4   POC LYMPH PERCENT 27.1 10 - 50 %L   MID (cbc) 0.5 0 - 0.9   POC MID % 6.4 0 - 12 %M   POC Granulocyte 5.7 2 - 6.9   Granulocyte percent 66.5 37 - 80 %G   RBC  4.61 4.04 - 5.48 M/uL   Hemoglobin 13.2 12.2 - 16.2 g/dL   HCT, POC 39.1 37.7 - 47.9 %   MCV 84.8 80 - 97 fL   MCH, POC 28.6 27 - 31.2 pg   MCHC 33.7 31.8 - 35.4 g/dL   RDW, POC 14.6 %   Platelet Count, POC 204 142 - 424 K/uL   MPV 10.6 0 - 99.8 fL  POCT glycosylated hemoglobin (Hb A1C)     Status: None   Collection Time: 12/24/16  4:30 PM  Result Value Ref Range   Hemoglobin A1C 5.6    Dg Chest 2 View  Result Date: 12/24/2016 CLINICAL DATA:  Leg swelling EXAM: CHEST  2 VIEW COMPARISON:  02/20/2014 FINDINGS: The heart size and mediastinal contours are within normal limits. Both lungs are clear. The visualized skeletal structures are unremarkable. IMPRESSION: No active cardiopulmonary disease. Electronically Signed   By: Inez Catalina M.D.   On: 12/24/2016 16:46    ASSESSMENT AND PLAN:  Misty Burke was seen today for foot swelling.  Diagnoses and all orders for this visit:  Peripheral edema: Likely 2/2 precipitous weight gain.  Will treat symptomatically with lasix per script.  She will come back in about 1 month for a recheck and annual physical.  -     EKG 12-Lead -     DG Chest 2 View; Future -     POCT CBC -     CMP14+EGFR -     POCT urinalysis dipstick -     Brain natriuretic peptide -     Sedimentation Rate -     C-reactive protein  Weight gain with edema -     POCT glycosylated hemoglobin (Hb A1C) -     TSH  Grief reaction with prolonged bereavement -     Ambulatory referral to Psychology  Other orders -     furosemide (LASIX) 20 MG tablet; Take 1/2 to 1 tab daily for swelling.  Do not take more than 3 tabs weekly.    The patient is advised to call or return to clinic if she does not see an improvement in symptoms, or to seek the care of the closest emergency department if she worsens with the above plan.   Philis Fendt, MHS, PA-C [facility] 12/24/2016 5:04 PM

## 2016-12-24 NOTE — ED Provider Notes (Signed)
CSN: [id number]     Arrival date & time 12/24/16  1046 History   None    Chief Complaint  Patient presents with  . Foot Swelling   (Consider location/radiation/quality/duration/timing/severity/associated sxs/prior Treatment) 61 yr old AA female presents to UC with >1 month hx of bilateral pedal edema. Pt states she "has [employer] insurance, but no one will take it". Pt denies recent injury, does lots of driving, sedentary lifestyle, no new meds.    The history is provided by the patient. No language interpreter was used.    Past Medical History:  Diagnosis Date  . Depression    Past Surgical History:  Procedure Laterality Date  . NO PAST SURGERIES     Family History  Problem Relation Age of Onset  . Cancer Other   . Diabetes Neg Hx   . Hypertension Neg Hx    Social History  Substance Use Topics  . Smoking status: Never Smoker  . Smokeless tobacco: Never Used  . Alcohol use No   OB History    No data available     Review of Systems  Constitutional: Positive for activity change. Negative for fever.  All other systems reviewed and are negative.   Allergies  Patient has no known allergies.  Home Medications   Prior to Admission medications   Medication Sig Start Date End Date Taking? Authorizing Provider  albuterol (PROVENTIL HFA;VENTOLIN HFA) 108 (90 BASE) MCG/ACT inhaler Inhale 2 puffs into the lungs 4 (four) times daily. 03/01/14   Reuben LikesKeller, David C, MD  cephALEXin (KEFLEX) 500 MG capsule Take 1 capsule (500 mg total) by mouth 4 (four) times daily. 04/29/16   Hayden RasmussenMabe, David, NP  fluticasone (FLONASE) 50 MCG/ACT nasal spray Place 2 sprays into both nostrils daily. 03/31/14   Rodolph Bongorey, Evan S, MD  meloxicam (MOBIC) 7.5 MG tablet Take 1 tablet (7.5 mg total) by mouth 2 (two) times daily after a meal. 09/12/15   Kindl, Quita SkyeJames D, MD  omeprazole (PRILOSEC) 40 MG capsule Take 1 capsule (40 mg total) by mouth daily. 03/31/14   Rodolph Bongorey, Evan S, MD   Meds Ordered and Administered  this Visit  Medications - No data to display  BP 128/77 (BP Location: Right Arm)   Pulse 66   Temp 98.1 F (36.7 C) (Oral)   Resp 17   SpO2 100%  No data found.   Physical Exam  Constitutional: She is oriented to person, place, and time. She appears well-developed and well-nourished. She is active.  Non-toxic appearance. She does not have a sickly appearance. She does not appear ill. No distress.  HENT:  Head: Normocephalic.  Right Ear: Tympanic membrane normal.  Left Ear: Tympanic membrane normal.  Nose: Nose normal.  Mouth/Throat: Uvula is midline and mucous membranes are normal.  Eyes: Pupils are equal, round, and reactive to light.  Neck: Trachea normal and normal range of motion. Muscular tenderness present. No Brudzinski's sign and no Kernig's sign noted.  Cardiovascular: Normal rate, regular rhythm and normal pulses.   Pulses:      Dorsalis pedis pulses are 2+ on the right side, and 2+ on the left side.  Pulmonary/Chest: Effort normal and breath sounds normal.  Musculoskeletal:       Right foot: There is swelling. There is normal range of motion, no tenderness, no bony tenderness, normal capillary refill, no crepitus, no deformity and no laceration.       Left foot: There is swelling. There is normal range of motion, no tenderness,  no bony tenderness, normal capillary refill, no crepitus and no laceration.  +1 edema noted/DP+2 bilaterally, good distal ROM and sensation.   Neurological: She is alert and oriented to person, place, and time. GCS eye subscore is 4. GCS verbal subscore is 5. GCS motor subscore is 6.  Skin: Skin is warm and dry. No rash noted.  Psychiatric: She has a normal mood and affect. Her speech is normal and behavior is normal.  Nursing note and vitals reviewed.   Urgent Care Course     Procedures (including critical care time)  Labs Review Labs Reviewed - No data to display  Imaging Review No results found.        MDM   1. Peripheral  edema     Please call or explore [employer] health care website for info on medical providers in your area accepting your insurance-call for appt to get established. Rest,elevate, wear support elastic hosiery(Elastic therapy is an option in [place] or local medical supply company can assist you with this). Go to Er for new or worsening issues such as CP, SOB, or palpitations, etc. Return to UC as needed. Pt verbalized understanding to this provider.    Clancy Gourd, NP 12/24/16 1252

## 2016-12-24 NOTE — Patient Instructions (Signed)
     IF you received an x-ray today, you will receive an invoice from [facility]. Please contact [facility] at [phone] with questions or concerns regarding your invoice.   IF you received labwork today, you will receive an invoice from [facility]. Please contact [facility] at [phone] with questions or concerns regarding your invoice.   Our billing staff will not be able to assist you with questions regarding bills from these companies.  You will be contacted with the lab results as soon as they are available. The fastest way to get your results is to activate your My Chart account. Instructions are located on the last page of this paperwork. If you have not heard from us regarding the results in 2 weeks, please contact this office.     

## 2016-12-25 ENCOUNTER — Telehealth: Payer: Self-pay | Admitting: Physician Assistant

## 2016-12-25 LAB — CMP14+EGFR
ALT: 15 IU/L (ref 0–32)
AST: 18 IU/L (ref 0–40)
Albumin/Globulin Ratio: 1.5 (ref 1.2–2.2)
Albumin: 4.2 g/dL (ref 3.5–5.5)
Alkaline Phosphatase: 105 IU/L (ref 39–117)
BILIRUBIN TOTAL: 0.4 mg/dL (ref 0.0–1.2)
BUN/Creatinine Ratio: 11 (ref 9–23)
BUN: 10 mg/dL (ref 6–24)
CALCIUM: 9.5 mg/dL (ref 8.7–10.2)
CHLORIDE: 102 mmol/L (ref 96–106)
CO2: 25 mmol/L (ref 18–29)
Creatinine, Ser: 0.94 mg/dL (ref 0.57–1.00)
GFR calc non Af Amer: 67 mL/min/{1.73_m2} (ref >59)
GFR, EST AFRICAN AMERICAN: 77 mL/min/{1.73_m2} (ref >59)
GLUCOSE: 82 mg/dL (ref 65–99)
Globulin, Total: 2.8 g/dL (ref 1.5–4.5)
Potassium: 4.5 mmol/L (ref 3.5–5.2)
Sodium: 142 mmol/L (ref 134–144)
TOTAL PROTEIN: 7 g/dL (ref 6.0–8.5)

## 2016-12-25 LAB — TSH: TSH: 1.41 u[IU]/mL (ref 0.450–4.500)

## 2016-12-25 LAB — C-REACTIVE PROTEIN: CRP: 1.9 mg/L (ref 0.0–4.9)

## 2016-12-25 NOTE — Telephone Encounter (Signed)
Patient would like to have Deliah BostonMichael Clark or a nurse to call her to discuss her lab results.  Her call back number is [phone]

## 2016-12-26 ENCOUNTER — Encounter: Payer: Self-pay | Admitting: Physician Assistant

## 2016-12-26 LAB — BRAIN NATRIURETIC PEPTIDE: BNP: 43.6 pg/mL (ref 0.0–100.0)

## 2016-12-26 NOTE — Telephone Encounter (Signed)
All normal correct?

## 2016-12-30 ENCOUNTER — Telehealth: Payer: Self-pay | Admitting: General Practice

## 2016-12-30 NOTE — Telephone Encounter (Signed)
Pt advised.

## 2016-12-30 NOTE — Telephone Encounter (Signed)
10-20 mg by mouth prn no more than three tabs weekly.   Lasix 20 mg tabs, #30, 3 refills

## 2016-12-30 NOTE — Telephone Encounter (Signed)
Please see rx and directions to clarify  1/2 pill qd day but no more than 3 per week?

## 2016-12-30 NOTE — Telephone Encounter (Signed)
Pharmacy is needing clarification on lasix 20 mg   Best number [phone]

## 2018-03-24 ENCOUNTER — Ambulatory Visit
Admission: RE | Admit: 2018-03-24 | Discharge: 2018-03-24 | Disposition: A | Discharge status: Home / Self Care | Payer: Medicare Other | Source: Ambulatory Visit | Attending: Internal Medicine | Admitting: Internal Medicine

## 2018-03-24 ENCOUNTER — Other Ambulatory Visit: Payer: Self-pay | Admitting: Internal Medicine

## 2018-03-24 DIAGNOSIS — Z1231 Encounter for screening mammogram for malignant neoplasm of breast: Secondary | ICD-10-CM

## 2018-03-27 ENCOUNTER — Encounter: Payer: Self-pay | Admitting: Physician Assistant

## 2018-03-30 ENCOUNTER — Encounter: Payer: Self-pay | Admitting: Family Medicine

## 2018-03-30 ENCOUNTER — Ambulatory Visit (INDEPENDENT_AMBULATORY_CARE_PROVIDER_SITE_OTHER): Payer: Medicare Other | Admitting: Family Medicine

## 2018-03-30 ENCOUNTER — Other Ambulatory Visit: Payer: Self-pay

## 2018-03-30 VITALS — BP 105/71 | HR 74 | Temp 98.1°F | Resp 16 | Ht 69.69 in | Wt 231.0 lb

## 2018-03-30 DIAGNOSIS — J309 Allergic rhinitis, unspecified: Secondary | ICD-10-CM | POA: Diagnosis not present

## 2018-03-30 DIAGNOSIS — F439 Reaction to severe stress, unspecified: Secondary | ICD-10-CM | POA: Diagnosis not present

## 2018-03-30 DIAGNOSIS — R220 Localized swelling, mass and lump, head: Secondary | ICD-10-CM

## 2018-03-30 DIAGNOSIS — R221 Localized swelling, mass and lump, neck: Secondary | ICD-10-CM | POA: Diagnosis not present

## 2018-03-30 MED ORDER — IPRATROPIUM BROMIDE 0.06 % NA SOLN: 1.0000 | Freq: Four times a day (QID) | NASAL | 1 refills | Status: DC | PRN

## 2018-03-30 MED ORDER — FLUTICASONE PROPIONATE 50 MCG/ACT NA SUSP: 1.0000 | Freq: Every day | NASAL | 6 refills | Status: DC

## 2018-03-30 NOTE — Patient Instructions (Addendum)
Most of your symptoms sound like they are due to allergies.  I would recommend initially starting with Allegra once per day every day, Flonase nasal spray 1 to 2 sprays at bedtime.  If needed for congestion or runny nose throughout the day, especially with exposure to cold, can try the ipratropium nasal spray.    I have referred you to an allergist to consider allergy testing and discussed the previous symptoms of tongue swelling and lip swelling.  If you do experience any return of tongue or lip swelling, or any difficulty swallowing, go straight to the emergency room or call 911.   I would recommend calling your therapist as it sounds like you have a lot of stressors right now. Please let me know if other names are needed. See other info below on stress as well.  Return to the clinic or go to the nearest emergency room if any of your symptoms worsen or new symptoms occur.  Thank you for coming in today.   Allergic Rhinitis, Adult Allergic rhinitis is an allergic reaction that affects the mucous membrane inside the nose. It causes sneezing, a runny or stuffy nose, and the feeling of mucus going down the back of the throat (postnasal drip). Allergic rhinitis can be mild to severe. There are two types of allergic rhinitis:  Seasonal. This type is also called hay fever. It happens only during certain seasons.  Perennial. This type can happen at any time of the year.  What are the causes? This condition happens when the body's defense system (immune system) responds to certain harmless substances called allergens as though they were germs.  Seasonal allergic rhinitis is triggered by pollen, which can come from grasses, trees, and weeds. Perennial allergic rhinitis may be caused by:  House dust mites.  Pet dander.  Mold spores.  What are the signs or symptoms? Symptoms of this condition include:  Sneezing.  Runny or stuffy nose (nasal congestion).  Postnasal drip.  Itchy  nose.  Tearing of the eyes.  Trouble sleeping.  Daytime sleepiness.  How is this diagnosed? This condition may be diagnosed based on:  Your medical history.  A physical exam.  Tests to check for related conditions, such as: ? Asthma. ? Pink eye. ? Ear infection. ? Upper respiratory infection.  Tests to find out which allergens trigger your symptoms. These may include skin or blood tests.  How is this treated? There is no cure for this condition, but treatment can help control symptoms. Treatment may include:  Taking medicines that block allergy symptoms, such as antihistamines. Medicine may be given as a shot, nasal spray, or pill.  Avoiding the allergen.  Desensitization. This treatment involves getting ongoing shots until your body becomes less sensitive to the allergen. This treatment may be done if other treatments do not help.  If taking medicine and avoiding the allergen does not work, new, stronger medicines may be prescribed.  Follow these instructions at home:  Find out what you are allergic to. Common allergens include smoke, dust, and pollen.  Avoid the things you are allergic to. These are some things you can do to help avoid allergens: ? Replace carpet with wood, tile, or vinyl flooring. Carpet can trap dander and dust. ? Do not smoke. Do not allow smoking in your home. ? Change your heating and air conditioning filter at least once a month. ? During allergy season:  Keep windows closed as much as possible.  Plan outdoor activities when pollen counts are  lowest. This is usually during the evening hours.  When coming indoors, change clothing and shower before sitting on furniture or bedding.  Take over-the-counter and prescription medicines only as told by your health care provider.  Keep all follow-up visits as told by your health care provider. This is important. Contact a health care provider if:  You have a fever.  You develop a persistent  cough.  You make whistling sounds when you breathe (you wheeze).  Your symptoms interfere with your normal daily activities. Get help right away if:  You have shortness of breath. Summary  This condition can be managed by taking medicines as directed and avoiding allergens.  Contact your health care provider if you develop a persistent cough or fever.  During allergy season, keep windows closed as much as possible. This information is not intended to replace advice given to you by your health care provider. Make sure you discuss any questions you have with your health care provider. Document Released: 04/08/2001 Document Revised: 08/21/2016 Document Reviewed: 08/21/2016 Elsevier Interactive Patient Education  2018 [place] and Stress Management Stress is a normal reaction to life events. It is what you feel when life demands more than you are used to or more than you can handle. Some stress can be useful. For example, the stress reaction can help you catch the last bus of the day, study for a test, or meet a deadline at work. But stress that occurs too often or for too long can cause problems. It can affect your emotional health and interfere with relationships and normal daily activities. Too much stress can weaken your immune system and increase your risk for physical illness. If you already have a medical problem, stress can make it worse. What are the causes? All sorts of life events may cause stress. An event that causes stress for one person may not be stressful for another person. Major life events commonly cause stress. These may be positive or negative. Examples include losing your job, moving into a new home, getting married, having a baby, or losing a loved one. Less obvious life events may also cause stress, especially if they occur day after day or in combination. Examples include working long hours, driving in traffic, caring for children, being in debt, or being  in a difficult relationship. What are the signs or symptoms? Stress may cause emotional symptoms including, the following:  Anxiety. This is feeling worried, afraid, on edge, overwhelmed, or out of control.  Anger. This is feeling irritated or impatient.  Depression. This is feeling sad, down, helpless, or guilty.  Difficulty focusing, remembering, or making decisions.  Stress may cause physical symptoms, including the following:  Aches and pains. These may affect your head, neck, back, stomach, or other areas of your body.  Tight muscles or clenched jaw.  Low energy or trouble sleeping.  Stress may cause unhealthy behaviors, including the following:  Eating to feel better (overeating) or skipping meals.  Sleeping too little, too much, or both.  Working too much or putting off tasks (procrastination).  Smoking, drinking alcohol, or using drugs to feel better.  How is this diagnosed? Stress is diagnosed through an assessment by your health care provider. Your health care provider will ask questions about your symptoms and any stressful life events.Your health care provider will also ask about your medical history and may order blood tests or other tests. Certain medical conditions and medicine can cause physical symptoms similar to stress.  Mental illness can cause emotional symptoms and unhealthy behaviors similar to stress. Your health care provider may refer you to a mental health professional for further evaluation. How is this treated? Stress management is the recommended treatment for stress.The goals of stress management are reducing stressful life events and coping with stress in healthy ways. Techniques for reducing stressful life events include the following:  Stress identification. Self-monitor for stress and identify what causes stress for you. These skills may help you to avoid some stressful events.  Time management. Set your priorities, keep a calendar of events,  and learn to say "no." These tools can help you avoid making too many commitments.  Techniques for coping with stress include the following:  Rethinking the problem. Try to think realistically about stressful events rather than ignoring them or overreacting. Try to find the positives in a stressful situation rather than focusing on the negatives.  Exercise. Physical exercise can release both physical and emotional tension. The key is to find a form of exercise you enjoy and do it regularly.  Relaxation techniques. These relax the body and mind. Examples include yoga, meditation, tai chi, biofeedback, deep breathing, progressive muscle relaxation, listening to music, being out in nature, journaling, and other hobbies. Again, the key is to find one or more that you enjoy and can do regularly.  Healthy lifestyle. Eat a balanced diet, get plenty of sleep, and do not smoke. Avoid using alcohol or drugs to relax.  Strong support network. Spend time with family, friends, or other people you enjoy being around.Express your feelings and talk things over with someone you trust.  Counseling or talktherapy with a mental health professional may be helpful if you are having difficulty managing stress on your own. Medicine is typically not recommended for the treatment of stress.Talk to your health care provider if you think you need medicine for symptoms of stress. Follow these instructions at home:  Keep all follow-up visits as directed by your health care provider.  Take all medicines as directed by your health care provider. Contact a health care provider if:  Your symptoms get worse or you start having new symptoms.  You feel overwhelmed by your problems and can no longer manage them on your own. Get help right away if:  You feel like hurting yourself or someone else. This information is not intended to replace advice given to you by your health care provider. Make sure you discuss any questions  you have with your health care provider. Document Released: 01/07/2001 Document Revised: 12/20/2015 Document Reviewed: 03/08/2013 Elsevier Interactive Patient Education  [employer].   If you have lab work done today you will be contacted with your lab results within the next 2 weeks.  If you have not heard from [place] then please contact us. The fastest way to get your results is to register for My Chart.   IF you received an x-ray today, you will receive an invoice from [facility]. Please contact [facility] at [phone] with questions or concerns regarding your invoice.   IF you received labwork today, you will receive an invoice from [facility]. Please contact [facility] at [phone] with questions or concerns regarding your invoice.   Our billing staff will not be able to assist you with questions regarding bills from these companies.  You will be contacted with the lab results as soon as they are available. The fastest way to get your results is to activate your My Chart account. Instructions are located on  the last page of this paperwork. If you have not heard from [place] regarding the results in 2 weeks, please contact this office.

## 2018-03-30 NOTE — Progress Notes (Signed)
Subjective:    Patient ID: Misty Burke, female    DOB: [birth year], 62 y.o.   MRN: [id number]  HPI Misty Burke is a 62 y.o. female Presents today for: Chief Complaint  Patient presents with  . Nasal Congestion    with some drainage in the throat with a throbbing in the throat   . Cough    symptoms since 8/1    Presents with above symptoms. Has neighbor who is a Water quality scientist". Feeling drained/depleted. After last court session for issue with  [facility] July 25th. Started with tired feeling.  Coughing, sneezing, pounding in R ear. Noticed swelling in left upper lip around 02/26/18, started with tingling but no weakness. No Rx meds at that time. Treated with claritin, improved in a few days. Noticed tongue swelling sometime in August after eating chocolate. Went to [facility] ER due to wait. Took benadryl at home. Tight feeling in throat at that time. Felt short of breath. Tongue swelling resolved in a few days. Did not seek care for tongue swelling after ER.  Went to chiropracter and neck adjustment helped ear, nose and throat symptoms. Then ear pain stared again.   Cough and congestion off and on since June?  Unable to specify if better or worse. Feels like ear may be better.  Congestion is still there. Min relief with antibiotic for a few days only. No discolored nasal discharge.   Tx: afrin nasal spray, only used twice - burned. No flonase. Allegra and claritin episodically only, some relief with allegra.  Didn't like zyrtec - didn't help.   Has met with counseling in past due to anxiety and depression symptoms, but not recent. Did meet with therapist in past due to depression after losing mom in 2018.  Some depression symptoms around time of birthday. No SI.  Feels overwhelmed Depression screen [facility] 2/9 03/30/2018 12/24/2016  Decreased Interest 0 0  Down, Depressed, Hopeless 0 0  PHQ - 2 Score 0 0          Also presents with a copy of office visit from ENT, Dr. Lucia Gaskins on 01/15/2018:  Right  ear discomfort feeling like it was blocked.  Slight sore throat at that time.  Nasal congestion.  Thought to have rhinitis with symptoms of eustachian tube dysfunction, was treated with amoxicillin 875 mg twice daily for 1 week, Flonase 2 sprays in each nostril at night, ibuprofen for pain or discomfort and option of audiogram if hearing difficulty.   Patient Active Problem List   Diagnosis Date Noted  . 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 Peripheral edema 12/24/2016   Past Medical History:  Diagnosis Date  . Depression    Past Surgical History:  Procedure Laterality Date  . BREAST CYST ASPIRATION Left   . NO PAST SURGERIES     No Known Allergies Prior to Admission medications   Not on File   Social History   Socioeconomic History  . Marital status: Single    Spouse name: Not on file  . Number of children: Not on file  . Years of education: Not on file  . Highest education level: Not on file  Occupational History  . Not on file  Social Needs  . Financial resource strain: Not on file  . Food insecurity:    Worry: Not on file    Inability: Not on file  . Transportation needs:    Medical: Not on file    Non-medical: Not  on file  Tobacco Use  . Smoking status: Never Smoker  . Smokeless tobacco: Never Used  Substance and Sexual Activity  . Alcohol use: Yes  . Drug use: No  . Sexual activity: Not Currently  Lifestyle  . Physical activity:    Days per week: Not on file    Minutes per session: Not on file  . Stress: Not on file  Relationships  . Social connections:    Talks on phone: Not on file    Gets together: Not on file    Attends religious service: Not on file    Active member of club or organization: Not on file    Attends meetings of clubs or organizations: Not on file    Relationship status: Not on file  . Intimate partner violence:    Fear of current or ex partner: Not on file    Emotionally abused: Not on file      Physically abused: Not on file    Forced sexual activity: Not on file  Other Topics Concern  . Not on file  Social History Narrative  . Not on file    Review of Systems     Objective:   Physical Exam  Constitutional: She is oriented to person, place, and time. She appears well-developed and well-nourished. No distress.  HENT:  Head: Normocephalic and atraumatic.  Right Ear: Hearing, tympanic membrane, external ear and ear canal normal.  Left Ear: Hearing, tympanic membrane, external ear and ear canal normal.  Nose: Nose normal.  Mouth/Throat: Oropharynx is clear and moist. No oropharyngeal exudate.  Min edema of R greater than left turbinate, slight infraorbital edema.  Speaking normally, no stridor, no apparent tongue or lip swelling.   Eyes: Pupils are equal, round, and reactive to light. Conjunctivae and EOM are normal.  Neck: Neck supple. No JVD present. No tracheal deviation present.  Cardiovascular: Normal rate, regular rhythm, normal heart sounds and intact distal pulses.  No murmur heard. Pulmonary/Chest: Effort normal and breath sounds normal. No respiratory distress. She has no wheezes. She has no rhonchi.  Lymphadenopathy:    She has no cervical adenopathy.  Neurological: She is alert and oriented to person, place, and time.  Skin: Skin is warm and dry. No rash noted.  Psychiatric: She has a normal mood and affect. Her behavior is normal.  Vitals reviewed.  Vitals:   03/30/18 1515  BP: 105/71  Pulse: 74  Resp: 16  Temp: 98.1 F (36.7 C)  TempSrc: Oral  SpO2: 97%  Weight: 231 lb (104.8 kg)  Height: 5' 9.69" (1.77 m)       Assessment & Plan:    Misty Burke is a 62 y.o. female Allergic rhinitis, unspecified seasonality, unspecified trigger - Plan: Ambulatory referral to Allergy, fluticasone (FLONASE) 50 MCG/ACT nasal spray, ipratropium (ATROVENT) 0.06 % nasal spray  -Suspected allergic rhinitis/allergic cause with possible secondary eustachian tube  dysfunction.  -Flonase 1 to 2 sprays nightly, Atrovent nasal spray during the day, as needed especially with exposure to cold as may have some component of vasomotor rhinitis  -Allegra over-the-counter once per day  Referral to allergist, RTC precautions if worsening  Swelling of lip, tongue, and throat - Plan: Ambulatory referral to Allergy  -Noted by history, no tongue, lip or other mucosal membrane swelling noted at this time.  Differential includes angioedema, ER/RTC precautions if symptoms recur but will refer to allergist for further discussion and testing  Situational stress  -Recommend she contact her previous therapist/counselor.  Handout given on stress and RTC precautions discussed if persistent symptoms or need for discussion of medications.  Also plans to establish with new primary care provider and return for physical.  . Meds ordered this encounter  Medications  . fluticasone (FLONASE) 50 MCG/ACT nasal spray    Sig: Place 1-2 sprays into both nostrils at bedtime.    Dispense:  16 g    Refill:  6  . ipratropium (ATROVENT) 0.06 % nasal spray    Sig: Place 1-2 sprays into both nostrils 4 (four) times daily as needed for rhinitis.    Dispense:  15 mL    Refill:  1   Patient Instructions    Most of your symptoms sound like they are due to allergies.  I would recommend initially starting with Allegra once per day every day, Flonase nasal spray 1 to 2 sprays at bedtime.  If needed for congestion or runny nose throughout the day, especially with exposure to cold, can try the ipratropium nasal spray.    I have referred you to an allergist to consider allergy testing and discussed the previous symptoms of tongue swelling and lip swelling.  If you do experience any return of tongue or lip swelling, or any difficulty swallowing, go straight to the emergency room or call 911.   I would recommend calling your therapist as it sounds like you have a lot of stressors right now. Please let  me know if other names are needed. See other info below on stress as well.  Return to the clinic or go to the nearest emergency room if any of your symptoms worsen or new symptoms occur.  Thank you for coming in today.   Allergic Rhinitis, Adult Allergic rhinitis is an allergic reaction that affects the mucous membrane inside the nose. It causes sneezing, a runny or stuffy nose, and the feeling of mucus going down the back of the throat (postnasal drip). Allergic rhinitis can be mild to severe. There are two types of allergic rhinitis:  Seasonal. This type is also called hay fever. It happens only during certain seasons.  Perennial. This type can happen at any time of the year.  What are the causes? This condition happens when the body's defense system (immune system) responds to certain harmless substances called allergens as though they were germs.  Seasonal allergic rhinitis is triggered by pollen, which can come from grasses, trees, and weeds. Perennial allergic rhinitis may be caused by:  House dust mites.  Pet dander.  Mold spores.  What are the signs or symptoms? Symptoms of this condition include:  Sneezing.  Runny or stuffy nose (nasal congestion).  Postnasal drip.  Itchy nose.  Tearing of the eyes.  Trouble sleeping.  Daytime sleepiness.  How is this diagnosed? This condition may be diagnosed based on:  Your medical history.  A physical exam.  Tests to check for related conditions, such as: ? Asthma. ? Pink eye. ? Ear infection. ? Upper respiratory infection.  Tests to find out which allergens trigger your symptoms. These may include skin or blood tests.  How is this treated? There is no cure for this condition, but treatment can help control symptoms. Treatment may include:  Taking medicines that block allergy symptoms, such as antihistamines. Medicine may be given as a shot, nasal spray, or pill.  Avoiding the allergen.  Desensitization. This  treatment involves getting ongoing shots until your body becomes less sensitive to the allergen. This treatment may be done if other treatments  do not help.  If taking medicine and avoiding the allergen does not work, new, stronger medicines may be prescribed.  Follow these instructions at home:  Find out what you are allergic to. Common allergens include smoke, dust, and pollen.  Avoid the things you are allergic to. These are some things you can do to help avoid allergens: ? Replace carpet with wood, tile, or vinyl flooring. Carpet can trap dander and dust. ? Do not smoke. Do not allow smoking in your home. ? Change your heating and air conditioning filter at least once a month. ? During allergy season:  Keep windows closed as much as possible.  Plan outdoor activities when pollen counts are lowest. This is usually during the evening hours.  When coming indoors, change clothing and shower before sitting on furniture or bedding.  Take over-the-counter and prescription medicines only as told by your health care provider.  Keep all follow-up visits as told by your health care provider. This is important. Contact a health care provider if:  You have a fever.  You develop a persistent cough.  You make whistling sounds when you breathe (you wheeze).  Your symptoms interfere with your normal daily activities. Get help right away if:  You have shortness of breath. Summary  This condition can be managed by taking medicines as directed and avoiding allergens.  Contact your health care provider if you develop a persistent cough or fever.  During allergy season, keep windows closed as much as possible. This information is not intended to replace advice given to you by your health care provider. Make sure you discuss any questions you have with your health care provider. Document Released: 04/08/2001 Document Revised: 08/21/2016 Document Reviewed: 08/21/2016 Elsevier Interactive  Patient Education  2018 [employer] and Stress Management Stress is a normal reaction to life events. It is what you feel when life demands more than you are used to or more than you can handle. Some stress can be useful. For example, the stress reaction can help you catch the last bus of the day, study for a test, or meet a deadline at work. But stress that occurs too often or for too long can cause problems. It can affect your emotional health and interfere with relationships and normal daily activities. Too much stress can weaken your immune system and increase your risk for physical illness. If you already have a medical problem, stress can make it worse. What are the causes? All sorts of life events may cause stress. An event that causes stress for one person may not be stressful for another person. Major life events commonly cause stress. These may be positive or negative. Examples include losing your job, moving into a new home, getting married, having a baby, or losing a loved one. Less obvious life events may also cause stress, especially if they occur day after day or in combination. Examples include working long hours, driving in traffic, caring for children, being in debt, or being in a difficult relationship. What are the signs or symptoms? Stress may cause emotional symptoms including, the following:  Anxiety. This is feeling worried, afraid, on edge, overwhelmed, or out of control.  Anger. This is feeling irritated or impatient.  Depression. This is feeling sad, down, helpless, or guilty.  Difficulty focusing, remembering, or making decisions.  Stress may cause physical symptoms, including the following:  Aches and pains. These may affect your head, neck, back, stomach, or other areas of your body.  Tight muscles or clenched jaw.  Low energy or trouble sleeping.  Stress may cause unhealthy behaviors, including the following:  Eating to feel better (overeating)  or skipping meals.  Sleeping too little, too much, or both.  Working too much or putting off tasks (procrastination).  Smoking, drinking alcohol, or using drugs to feel better.  How is this diagnosed? Stress is diagnosed through an assessment by your health care provider. Your health care provider will ask questions about your symptoms and any stressful life events.Your health care provider will also ask about your medical history and may order blood tests or other tests. Certain medical conditions and medicine can cause physical symptoms similar to stress. Mental illness can cause emotional symptoms and unhealthy behaviors similar to stress. Your health care provider may refer you to a mental health professional for further evaluation. How is this treated? Stress management is the recommended treatment for stress.The goals of stress management are reducing stressful life events and coping with stress in healthy ways. Techniques for reducing stressful life events include the following:  Stress identification. Self-monitor for stress and identify what causes stress for you. These skills may help you to avoid some stressful events.  Time management. Set your priorities, keep a calendar of events, and learn to say "no." These tools can help you avoid making too many commitments.  Techniques for coping with stress include the following:  Rethinking the problem. Try to think realistically about stressful events rather than ignoring them or overreacting. Try to find the positives in a stressful situation rather than focusing on the negatives.  Exercise. Physical exercise can release both physical and emotional tension. The key is to find a form of exercise you enjoy and do it regularly.  Relaxation techniques. These relax the body and mind. Examples include yoga, meditation, tai chi, biofeedback, deep breathing, progressive muscle relaxation, listening to music, being out in nature, journaling,  and other hobbies. Again, the key is to find one or more that you enjoy and can do regularly.  Healthy lifestyle. Eat a balanced diet, get plenty of sleep, and do not smoke. Avoid using alcohol or drugs to relax.  Strong support network. Spend time with family, friends, or other people you enjoy being around.Express your feelings and talk things over with someone you trust.  Counseling or talktherapy with a mental health professional may be helpful if you are having difficulty managing stress on your own. Medicine is typically not recommended for the treatment of stress.Talk to your health care provider if you think you need medicine for symptoms of stress. Follow these instructions at home:  Keep all follow-up visits as directed by your health care provider.  Take all medicines as directed by your health care provider. Contact a health care provider if:  Your symptoms get worse or you start having new symptoms.  You feel overwhelmed by your problems and can no longer manage them on your own. Get help right away if:  You feel like hurting yourself or someone else. This information is not intended to replace advice given to you by your health care provider. Make sure you discuss any questions you have with your health care provider. Document Released: 01/07/2001 Document Revised: 12/20/2015 Document Reviewed: 03/08/2013 Elsevier Interactive Patient Education  [employer].   If you have lab work done today you will be contacted with your lab results within the next 2 weeks.  If you have not heard from [place] then please contact us. The fastest way  to get your results is to register for My Chart.   IF you received an x-ray today, you will receive an invoice from [facility]. Please contact [facility] at [phone] with questions or concerns regarding your invoice.   IF you received labwork today, you will receive an invoice from [place]. Please contact [facility] at  [phone] with questions or concerns regarding your invoice.   Our billing staff will not be able to assist you with questions regarding bills from these companies.  You will be contacted with the lab results as soon as they are available. The fastest way to get your results is to activate your My Chart account. Instructions are located on the last page of this paperwork. If you have not heard from [place] regarding the results in 2 weeks, please contact this office.       Signed,   Merri Ray, MD [facility].  03/31/18 8:13 AM

## 2018-03-31 ENCOUNTER — Encounter: Payer: Self-pay | Admitting: Family Medicine

## 2018-04-15 ENCOUNTER — Ambulatory Visit: Payer: Medicare Other | Admitting: Allergy

## 2018-04-15 ENCOUNTER — Encounter: Payer: Self-pay | Admitting: Allergy

## 2018-04-15 VITALS — BP 116/68 | HR 71 | Temp 97.7°F | Resp 16 | Ht 69.0 in | Wt 229.4 lb

## 2018-04-15 DIAGNOSIS — H1010 Acute atopic conjunctivitis, unspecified eye: Secondary | ICD-10-CM

## 2018-04-15 DIAGNOSIS — T783XXA Angioneurotic edema, initial encounter: Secondary | ICD-10-CM

## 2018-04-15 DIAGNOSIS — T783XXD Angioneurotic edema, subsequent encounter: Secondary | ICD-10-CM | POA: Diagnosis not present

## 2018-04-15 DIAGNOSIS — J309 Allergic rhinitis, unspecified: Secondary | ICD-10-CM

## 2018-04-15 MED ORDER — EPINEPHRINE 0.3 MG/0.3ML IJ SOAJ: 0.3000 mg | Freq: Once | INTRAMUSCULAR | 2 refills | Status: DC

## 2018-04-15 NOTE — Patient Instructions (Addendum)
Swelling  - swelling or angioedema can be caused by various entities.  Typically swelling episodes can be caused by allergen trigger and is due to histamine.  In cases of isolated swelling without associated rash it can be due to bradykinin which does not get better with antihistamine or steroid medications.     - will obtain labs to assess for hereditary angioedema as well as tryptase   - environmental allergy skin testing today is positive to grass pollen, weed pollen, tree pollen, molds, dust mites, cat, dog and horse   - select food allergy skin testing is positive to milk and tomato.  Would remove these from diet and determine if swelling episodes subside.  Will obtain serum IgE to milk and tomato and if low or negative will likely be able to re-incorporate into the diet.  Will provide with epinephrine device to have access to in case of allergic reaction.  Follow emergency action plan in case of reaction.     - recommend taking daily antihistamine like Zyrtec 10mg , Xyzal 5mg  or Allegra 180mg  daily at this time.  Environmental allergy   - allergen avoidance measures as above   - antihistamine as above     - for nasal congestion recommend use of like OTC Flonase, Rhinocort or Nasacort 2 sprays each nostril daily   - for itchy/watery/red eyes recommend use of OTC allergy eye drop like Alaway or Zaditor 1 drop each eye up to twice a day as needed  Follow-up 4-6 months or sooner if needed

## 2018-04-15 NOTE — Progress Notes (Signed)
New Patient Note  RE: Misty Burke MRN: [id number] DOB: 04/19/1956 Date of Office Visit: 04/15/2018  Referring provider: Shade FloodGreene, Jeffrey R, MD Primary care provider: Shade FloodGreene, Jeffrey R, MD  Chief Complaint: swelling episodes  History of present illness: Misty Burke is a 62 y.o. female presenting today for consultation for ear throbbing, facial swelling, and throat swelling.  HPI obtained by Dr. Gwyneth RevelsKrienke and confirmed by myself (Dr. Delorse LekPadgett) with patient.    Patient reports that in early August she started having swelling in her left upper lip. She reports that it started when she was watching a play, the only thing she remembers eating that was different was a new brand of cheese popcorn. She took some claritin at that time and went home. She started taking benadryl for it. It lasted for 3-4 days. She denied any other symptoms including rashes, difficulty breathing, nausea, vomiting, or diarrhea.   About 1 week later she reports an episode of throat swelling where she reports that she was having difficulty swallowing. This started late at night and she went to the ER for this, she reports that the wait was too long so she went home and took some benadryl. She reports that there was some improvement the next day.   She went to her PCP for the symptoms and was given flonase and allegra for her allergy symptoms. She has been taking them as prescribed. A few weeks later she reports that she had an episode of left sided facial swelling, she started having some left upper lip tingling, and the left side of her face became heavy and swollen. She was taking allegra and flonase at that time. She reports that this lasted for about 3-4 days.   Patient reported some nasal congestion, itchy eyes, and sinus pressure. She denies any runny nose, teary eyes, cough, or wheezing. She reports that she does not think she has seasonal allergies however she has been having some issues since she has moved to  [place] in [place] from [place]. She reports that she had allergy testing done in [place] and that they did not find any allergies. She has been using Flonase and allegra and she reports that she thinks it helps.   She does not think she has eaten anything new other then the things listed above, denies any new hobbies or exposure to new animals. Denies any smoking or smoke exposure.     Review of systems: Review of Systems  Constitutional: Negative for chills, fever and malaise/fatigue.  HENT: Positive for congestion. Negative for ear discharge, ear pain, nosebleeds and sore throat.   Eyes: Negative for pain, discharge and redness.  Respiratory: Negative for cough, shortness of breath and wheezing.   Cardiovascular: Negative for chest pain.  Gastrointestinal: Negative for abdominal pain, constipation, diarrhea, heartburn, nausea and vomiting.  Musculoskeletal: Negative for joint pain.  Skin: Negative for itching and rash.  Neurological: Negative for headaches.    All other systems negative unless noted above in HPI  Past medical history: Past Medical History:  Diagnosis Date  . Angio-edema   . Depression   . Urticaria     Past surgical history: Past Surgical History:  Procedure Laterality Date  . BREAST CYST ASPIRATION Left   . NO PAST SURGERIES      Family history:  Family History  Problem Relation Age of Onset  . Cancer Other   . Cancer Father   . Diabetes Neg Hx   . Hypertension Neg Hx   .  Breast cancer Neg Hx     Social history: She lives in a home with carpeting with gas and electric heating and central cooling.  There are no pets in the home.  There is no concern for water damage, mildew or roaches in the home.  She is retired.  She denies a smoking history.  Medication List: Allergies as of 04/15/2018   No Known Allergies     Medication List        Accurate as of 04/15/18 12:23 PM. Always use your most recent med list.          EPINEPHrine 0.3  mg/0.3 mL Soaj injection Commonly known as:  EPI-PEN Inject 0.3 mLs (0.3 mg total) into the muscle once for 1 dose.   fexofenadine 180 MG tablet Commonly known as:  ALLEGRA Take 180 mg by mouth daily.   fluticasone 50 MCG/ACT nasal spray Commonly known as:  FLONASE Place 1-2 sprays into both nostrils at bedtime.   ipratropium 0.06 % nasal spray Commonly known as:  ATROVENT Place 1-2 sprays into both nostrils 4 (four) times daily as needed for rhinitis.       Known medication allergies: No Known Allergies   Physical examination: Blood pressure 116/68, pulse 71, temperature 97.7 F (36.5 C), resp. rate 16, height 5\' 9"  (1.753 m), weight 229 lb 6.4 oz (104.1 kg), SpO2 98 %.  General: Alert, interactive, in no acute distress. HEENT: PERRLA, TMs pearly gray, turbinates mildly edematous without discharge, post-pharynx mildly erythematous. Neck: Supple without lymphadenopathy. Lungs: Clear to auscultation without wheezing, rhonchi or rales. {no increased work of breathing. CV: Normal S1, S2 without murmurs. Abdomen: Nondistended, nontender. Skin: Warm and dry, without lesions or rashes. Extremities:  No clubbing, cyanosis or edema. Neuro:   Grossly intact.  Diagnositics/Labs:  Allergy testing: Environmental allergy skin prick testing is positive to meadow fescue, lambs quarter, mugwort, ash, molds, dust mites, cat, dog, horse Select food allergy skin prick testing is positive to milk and tomato Allergy testing results were read and interpreted by provider, documented by clinical staff.   Assessment and plan: Angioedema  - angioedema can be caused by various entities.  Typically swelling episodes can be caused by allergen trigger and is histaminergic.  In cases of isolated swelling without associated hives/rash it can be due to bradykinin accumulation (hereditary angioedema) which does not get resolve with antihistamine or steroid medications.     - will obtain labs to assess  for hereditary angioedema as well as tryptase   - environmental allergy skin testing today is positive to grass pollen, weed pollen, tree pollen, molds, dust mites, cat, dog and horse   - select food allergy skin testing is positive to milk and tomato.  Would remove these from diet and determine if swelling episodes subside.  Will obtain serum IgE to milk and tomato and if low or negative will likely be able to re-incorporate into the diet.  Will provide with epinephrine device to have access to in case of allergic reaction.  Follow emergency action plan in case of reaction.     - recommend taking daily antihistamine like Zyrtec 10mg , Xyzal 5mg  or Allegra 180mg  daily at this time.  Allergic rhinoconjunctivitis   - allergen avoidance measures as above   - antihistamine as above     - for nasal congestion recommend use of like OTC Flonase, Rhinocort or Nasacort 2 sprays each nostril daily   - for itchy/watery/red eyes recommend use of OTC allergy eye drop like Alaway  or Zaditor 1 drop each eye up to twice a day as needed  Follow-up 4-6 months or sooner if needed  I appreciate the opportunity to take part in Marquita's care. Please do not hesitate to contact me with questions.  Sincerely,   Margo Aye, MD Allergy/Immunology [facility]

## 2018-04-20 LAB — HAE INTERPRETATION:

## 2018-04-20 LAB — ALLERGEN MILK: Milk IgE: 0.33 kU/L — AB

## 2018-04-20 LAB — HEREDITARY ANGIOEDEMA
C1 ESTERASE INHIBITOR, SERUM: 31 mg/dL (ref 21–39)
COMPLEMENT C4, SERUM: 34 mg/dL (ref 14–44)

## 2018-04-20 LAB — C1 ESTERASE INHIBITOR, FUNC: C1 EST.INHIB.FUNCT.: 87 %{normal}

## 2018-04-20 LAB — TRYPTASE: Tryptase: 8.5 ug/L (ref 2.2–13.2)

## 2018-04-20 LAB — ALLERGEN, TOMATO F25: Allergen Tomato, IgE: <0.1 kU/L

## 2018-04-21 ENCOUNTER — Telehealth: Payer: Self-pay | Admitting: Allergy

## 2018-04-21 MED ORDER — EPINEPHRINE 0.3 MG/0.3ML IJ SOAJ: 0.3000 mg | Freq: Once | INTRAMUSCULAR | 2 refills | Status: DC

## 2018-04-21 NOTE — Telephone Encounter (Signed)
Sensation of nuts stuck in chest does not necessarily mean she was reacting to it.  Would recommend she keep a food diary to determine if there are other foods that may also cause symptoms.  Skin testing to peanut was negative however was eating tree nut without issue prior.  Agree with sending epipen to different pharmacy and would like for her to have access to epipen.

## 2018-04-21 NOTE — Telephone Encounter (Signed)
Spoke to patient advise Epipen was sent to pharmacy states [facility] not [facility] would [facility]. Also patient states when she ate nuts and it felt like it was stuck in her chest until she took allegra she got relief. Tree nuts were not tested Dr Delorse Lek please advise

## 2018-04-21 NOTE — Addendum Note (Signed)
Addended by: Mliss Fritz I on: 04/21/2018 09:02 AM   Modules accepted: Orders

## 2018-04-21 NOTE — Telephone Encounter (Signed)
Spoke to patient advised as written per Dr Padgett patient verbalized understanding 

## 2018-04-21 NOTE — Telephone Encounter (Signed)
[facility] She said he told her if she had any issues, to call back. She said she ate some nuts yesterday and started having chest tightening and when she got home, she took an Allegra and drank 3 bottles of water and then felt better. She also said that [facility] not [facility] record of [facility] being sent in.

## 2018-05-03 ENCOUNTER — Other Ambulatory Visit: Payer: Self-pay

## 2018-05-03 MED ORDER — EPINEPHRINE 0.3 MG/0.3ML IJ SOAJ: 0.3000 mg | Freq: Once | INTRAMUSCULAR | 2 refills | Status: AC

## 2018-05-07 ENCOUNTER — Ambulatory Visit: Payer: Medicare Other | Admitting: Allergy

## 2018-07-30 NOTE — Progress Notes (Signed)
[facility] - Clinic Note  07/31/2018     CHIEF COMPLAINT Patient presents for Retina Evaluation   HISTORY OF PRESENT ILLNESS: Misty Burke is a 63 y.o. female who presents to the clinic today for:  Flashes/floaters OD Was seen by Dr. Maple Hudson on call at some point before Christmas Pt calls on call physician with complaint of new flashes in addition to floater HPI    Retina Evaluation    In right eye.  Associated Symptoms Flashes and Floaters.  Negative for Pain and Trauma.  I, the attending physician,  performed the HPI with the patient and updated documentation appropriately.          Comments    2 wk history of floaters OD. 2 day history of new onset flashes OD--occurring intermittently across superior and inferior visual fields.       Last edited by Rennis Chris, MD on 07/31/2018 11:26 AM. (History)      Referring physician: Shade Flood, MD [address] [place], [place] [address]  HISTORICAL INFORMATION:   Selected notes from the MEDICAL RECORD NUMBER    CURRENT MEDICATIONS: No current outpatient medications on file. (Ophthalmic Drugs)   No current facility-administered medications for this visit.  (Ophthalmic Drugs)   Current Outpatient Medications (Other)  Medication Sig  . fexofenadine (ALLEGRA) 180 MG tablet Take 180 mg by mouth daily.  . fluticasone (FLONASE) 50 MCG/ACT nasal spray Place 1-2 sprays into both nostrils at bedtime.  Marland Kitchen ipratropium (ATROVENT) 0.06 % nasal spray Place 1-2 sprays into both nostrils 4 (four) times daily as needed for rhinitis.   No current facility-administered medications for this visit.  (Other)      REVIEW OF SYSTEMS: ROS    Positive for: Musculoskeletal, Allergic/Imm   Negative for: Constitutional, Gastrointestinal, Neurological, Skin, Genitourinary, HENT, Endocrine, Cardiovascular, Eyes, Respiratory, Psychiatric, Heme/Lymph   Last edited by Rennis Chris, MD on 07/31/2018 11:24 AM. (History)        ALLERGIES No Known Allergies  PAST MEDICAL HISTORY Past Medical History:  Diagnosis Date  . Angio-edema   . Depression   . Urticaria    Past Surgical History:  Procedure Laterality Date  . BREAST CYST ASPIRATION Left   . NO PAST SURGERIES      FAMILY HISTORY Family History  Problem Relation Age of Onset  . Cancer Other   . Cancer Father   . Diabetes Neg Hx   . Hypertension Neg Hx   . Breast cancer Neg Hx     SOCIAL HISTORY Social History   Tobacco Use  . Smoking status: Never Smoker  . Smokeless tobacco: Never Used  Substance Use Topics  . Alcohol use: Yes  . Drug use: No         OPHTHALMIC EXAM:  Base Eye Exam    Visual Acuity (Snellen - Linear)      Right Left   Dist [place] 20/20 -2 20/20 -1       Tonometry (Tonopen, 11:19 AM)      Right Left   Pressure 17 19       Pupils      Pupils Dark Light Shape React APD   Right PERRL 2 1 Round + None   Left PERRL 2 1 Round + None       Visual Fields (Counting fingers)      Left Right    Full        Extraocular Movement      Right Left  Full Full       Neuro/Psych    Oriented x3:  Yes   Mood/Affect:  Normal       Dilation    Both eyes:  1.0% Mydriacyl, 2.5% Phenylephrine @ 11:23 AM        Slit Lamp and Fundus Exam    External Exam      Right Left   External Normal Normal       Slit Lamp Exam      Right Left   Lids/Lashes Normal; mild fat prolapse inf lid Normal   Conjunctiva/Sclera White and quiet White and quiet   Cornea Clear Clear   Anterior Chamber Deep and quiet Deep and quiet   Iris Round and reactive Round and reactive   Lens 2+ NSC, 2+ CC 2+ NSC, 2+ CC   Vitreous Vitreous syneresis, Posterior vitreous detachment syneresis       Fundus Exam      Right Left   Disc Normal Normal   C/D Ratio 0.3 0.4   Macula flat; good foveal reflex flat; good foveal reflex   Vessels Normal Normal   Periphery attached; VR tufts at 0600 and 1200 midzone attached           IMAGING AND PROCEDURES  Imaging and Procedures for 07/31/18           ASSESSMENT/PLAN:    ICD-10-CM   1. 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 Posterior vitreous detachment of right eye H43.811   3. Combined forms of age-related cataract of both eyes H25.813     1. Cystic retinal tufts OD  Cystic retinal tufts at 12 and 0600 midzone  Likely etiology of intermittent photopsias  Discussed findings, prognosis and treatment options including laser retinopexy and cryotherapy   recommend laser retinopexy  Pt wishes to defer to Monday  F/u Monday for laser retinopexy OD  2. PVD / vitreous syneresis OU  OD symptomatic with flashes/floaters x2 wks -- was seen by Dr. Maple Hudson on call prior to Xmas 2019  Discussed findings and prognosis  No other RT or RD on 360 scleral depressed exam  Reviewed s/s of RT/RD  Strict return precautions for any such RT/RD signs/symptoms  3. Combined cataract OU - The symptoms of cataract, surgical options, and treatments and risks were discussed with patient. - discussed diagnosis and progression - not yet visually significant - monitor for now   Ophthalmic Meds Ordered this visit:  No orders of the defined types were placed in this encounter.      Return in about 2 days (around 08/02/2018) for Dilated Exam, OCT, Laser OD.  There are no Patient Instructions on file for this visit.   Explained the diagnoses, plan, and follow up with the patient and they expressed understanding.  Patient expressed understanding of the importance of proper follow up care.   Karie Chimera, M.D., Ph.D. Diseases & Surgery of the Retina and Vitreous [facility] 07/31/18     Abbreviations: M myopia (nearsighted); A astigmatism; H hyperopia (farsighted); P presbyopia; Mrx spectacle prescription;  CTL contact lenses; OD right eye; OS left eye; OU both eyes  XT exotropia; ET esotropia; PEK punctate epithelial keratitis; PEE  punctate epithelial erosions; DES dry eye syndrome; MGD meibomian gland dysfunction; ATs artificial tears; PFAT's preservative free artificial tears; NSC nuclear sclerotic cataract; PSC posterior subcapsular cataract; ERM epi-retinal membrane; PVD posterior vitreous detachment; RD retinal detachment; DM diabetes mellitus; DR diabetic retinopathy; NPDR non-proliferative diabetic  retinopathy; PDR proliferative diabetic retinopathy; CSME clinically significant macular edema; DME diabetic macular edema; dbh dot blot hemorrhages; CWS cotton wool spot; POAG primary open angle glaucoma; C/D cup-to-disc ratio; HVF humphrey visual field; GVF goldmann visual field; OCT optical coherence tomography; IOP intraocular pressure; BRVO Branch retinal vein occlusion; CRVO central retinal vein occlusion; CRAO central retinal artery occlusion; BRAO branch retinal artery occlusion; RT retinal tear; SB scleral buckle; PPV pars plana vitrectomy; VH Vitreous hemorrhage; PRP panretinal laser photocoagulation; IVK intravitreal kenalog; VMT vitreomacular traction; MH Macular hole;  NVD neovascularization of the disc; NVE neovascularization elsewhere; AREDS age related eye disease study; ARMD age related macular degeneration; POAG primary open angle glaucoma; EBMD epithelial/anterior basement membrane dystrophy; ACIOL anterior chamber intraocular lens; IOL intraocular lens; PCIOL posterior chamber intraocular lens; Phaco/IOL phacoemulsification with intraocular lens placement; [place] photorefractive keratectomy; LASIK laser assisted in situ keratomileusis; HTN hypertension; DM diabetes mellitus; COPD chronic obstructive pulmonary disease

## 2018-07-31 ENCOUNTER — Ambulatory Visit (INDEPENDENT_AMBULATORY_CARE_PROVIDER_SITE_OTHER): Payer: Medicare Other | Admitting: Ophthalmology

## 2018-07-31 ENCOUNTER — Encounter (INDEPENDENT_AMBULATORY_CARE_PROVIDER_SITE_OTHER): Payer: Self-pay | Admitting: Ophthalmology

## 2018-07-31 DIAGNOSIS — H35461 Secondary vitreoretinal degeneration, right eye: Secondary | ICD-10-CM

## 2018-07-31 DIAGNOSIS — H43811 Vitreous degeneration, right eye: Secondary | ICD-10-CM

## 2018-07-31 DIAGNOSIS — H25813 Combined forms of age-related cataract, bilateral: Secondary | ICD-10-CM | POA: Diagnosis not present

## 2018-08-01 NOTE — Progress Notes (Signed)
[facility] - Clinic Note  08/02/2018     CHIEF COMPLAINT Patient presents for Retina Follow Up   HISTORY OF PRESENT ILLNESS: Misty Burke is a 63 y.o. female who presents to the clinic today for:  Flashes/floaters OD Was seen by Dr. Maple Hudson on call at some point before Christmas Pt calls on call physician with complaint of new flashes in addition to floater HPI    Retina Follow Up    Patient presents with  Other.  In right eye.  This started 2 weeks ago.  Severity is mild.  Duration of 2 weeks.  Since onset it is stable.  I, the attending physician,  performed the HPI with the patient and updated documentation appropriately.          Comments    63 y/o female pt returning after 2 days for laser retinopexy OD for cystic retinal tuft.  No change in [place] OU.  Denies pain, but has flashes OD at night, and constantly sees what looks like a "string" in her right eye vision.  No gtts.  Ready for procedure.       Last edited by Rennis Chris, MD on 08/02/2018 12:30 PM. (History)      Referring physician: Shade Flood, MD [address] [place], [place] [address]  HISTORICAL INFORMATION:   Selected notes from the MEDICAL RECORD NUMBER    CURRENT MEDICATIONS: Current Outpatient Medications (Ophthalmic Drugs)  Medication Sig  . prednisoLONE acetate (PRED FORTE) 1 % ophthalmic suspension Place 1 drop into the right eye 4 (four) times daily for 7 days.   No current facility-administered medications for this visit.  (Ophthalmic Drugs)   Current Outpatient Medications (Other)  Medication Sig  . fexofenadine (ALLEGRA) 180 MG tablet Take 180 mg by mouth daily.  . fluticasone (FLONASE) 50 MCG/ACT nasal spray Place 1-2 sprays into both nostrils at bedtime.  Marland Kitchen ipratropium (ATROVENT) 0.06 % nasal spray Place 1-2 sprays into both nostrils 4 (four) times daily as needed for rhinitis.   No current facility-administered medications for this visit.  (Other)      REVIEW  OF SYSTEMS: ROS    Positive for: Eyes   Negative for: Constitutional, Gastrointestinal, Neurological, Skin, Genitourinary, Musculoskeletal, HENT, Endocrine, Cardiovascular, Respiratory, Psychiatric, Allergic/Imm, Heme/Lymph   Last edited by Celine Mans, COA on 08/02/2018 11:10 AM. (History)       ALLERGIES No Known Allergies  PAST MEDICAL HISTORY Past Medical History:  Diagnosis Date  . Angio-edema   . Depression   . Urticaria    Past Surgical History:  Procedure Laterality Date  . BREAST CYST ASPIRATION Left   . NO PAST SURGERIES      FAMILY HISTORY Family History  Problem Relation Age of Onset  . Cancer Other   . Cancer Father   . Diabetes Neg Hx   . Hypertension Neg Hx   . Breast cancer Neg Hx     SOCIAL HISTORY Social History   Tobacco Use  . Smoking status: Never Smoker  . Smokeless tobacco: Never Used  Substance Use Topics  . Alcohol use: Yes  . Drug use: No         OPHTHALMIC EXAM:  Base Eye Exam    Visual Acuity (Snellen - Linear)      Right Left   Dist [place] 20/20 -2 20/20 -       Tonometry (Tonopen, 11:10 AM)      Right Left   Pressure 19 20  Squeezing       Pupils      Dark Light Shape React APD   Right 2 1 Round + None   Left 2 1 Round + None       Visual Fields (Counting fingers)      Left Right    Full Full       Extraocular Movement      Right Left    Full, Ortho Full, Ortho       Neuro/Psych    Oriented x3:  Yes   Mood/Affect:  Normal       Dilation    Right eye:  1.0% Mydriacyl, 2.5% Phenylephrine @ 11:11 AM        Slit Lamp and Fundus Exam    External Exam      Right Left   External Normal Normal       Slit Lamp Exam      Right Left   Lids/Lashes Normal; mild fat prolapse inf lid Normal   Conjunctiva/Sclera White and quiet White and quiet   Cornea Clear Clear   Anterior Chamber Deep and quiet Deep and quiet   Iris Round and reactive Round and reactive   Lens 2+ NSC, 2+ CC 2+ NSC, 2+ CC   Vitreous  Vitreous syneresis, Posterior vitreous detachment syneresis       Fundus Exam      Right Left   Disc Normal Normal   C/D Ratio 0.3 0.4   Macula flat; good foveal reflex flat; good foveal reflex   Vessels Normal Normal   Periphery attached; VR tufts at 0600 and 1100 equator attached        Refraction    Manifest Refraction      Sphere Cylinder Dist [place]   Right -0.25 Sphere 20/20   Left [place] Sphere 20/20-          IMAGING AND PROCEDURES  Imaging and Procedures for 07/31/18  OCT, Retina - OU - Both Eyes       Right Eye Quality was good. Central Foveal Thickness: 249. Progression has no prior data. Findings include normal foveal contour, no IRF, no SRF.   Left Eye Quality was good. Central Foveal Thickness: 243. Progression has no prior data. Findings include normal foveal contour, no IRF, no SRF.   Notes *Images captured and stored on drive  Diagnosis / Impression:  NFP; no IRF/SRF OU  Clinical management:  See below  Abbreviations: NFP - Normal foveal profile. CME - cystoid macular edema. PED - pigment epithelial detachment. IRF - intraretinal fluid. SRF - subretinal fluid. EZ - ellipsoid zone. ERM - epiretinal membrane. ORA - outer retinal atrophy. ORT - outer retinal tubulation. SRHM - subretinal hyper-reflective material        Repair Retinal Breaks, Laser - OD - Right Eye       LASER PROCEDURE NOTE  Procedure:  Barrier laser retinopexy using slit lamp laser, RIGHT eye   Diagnosis:   Retinal breaks w/ VR tufts, RIGHT eye                     Tufts located at 1100 and 0600 o'clock anterior to equator   Surgeon: Rennis ChrisBrian Angelissa Supan, MD, PhD  Anesthesia: Topical  Informed consent obtained, operative eye marked, and time out performed prior to initiation of laser.   Laser settings:  Lumenis Smart532 laser, slit lamp Lens: Mainster PRP 165 Power: 230-250 mW Spot size: 200 microns Duration: 30 msec  # spots: 370  Placement of laser: Using a Mainster PRP  165 contact lens at the slit lamp, laser was placed in three confluent rows around each VR tuft w/ retinal break at 11 and 6 oclock anterior to equator.  Complications: None.  Patient tolerated the procedure well and received written and verbal post-procedure care information/education.                  ASSESSMENT/PLAN:    ICD-10-CM   1. 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 Retinal break, right H33.301 Repair Retinal Breaks, Laser - OD - Right Eye  3. Posterior vitreous detachment of right eye H43.811   4. Combined forms of age-related cataract of both eyes H25.813   5. Retinal edema H35.81 OCT, Retina - OU - Both Eyes    1. VR tufts w/ retinal breaks OD  Cystic retinal tufts at 11 and 0600 equator  Likely etiology of intermittent photopsias  Discussed findings, prognosis and treatment options including laser retinopexy and cryotherapy   recommend laser retinopexy OD today, 1.6.20  RBA of procedure discussed, questions answered  informed consent obtained and signed  see procedure note  Start PF QID OD x7 days  F/u 3-4 wks  2. PVD / vitreous syneresis OU  OD symptomatic with flashes/floaters x2 wks -- was seen by Dr. Maple Hudson on call prior to Xmas 2019  Discussed findings and prognosis  No other RT or RD on 360 scleral depressed exam  Reviewed s/s of RT/RD  Strict return precautions for any such RT/RD signs/symptoms  3. Combined cataract OU - The symptoms of cataract, surgical options, and treatments and risks were discussed with patient. - discussed diagnosis and progression - not yet visually significant - monitor for now   Ophthalmic Meds Ordered this visit:  Meds ordered this encounter  Medications  . DISCONTD: prednisoLONE acetate (PRED FORTE) 1 % ophthalmic suspension    Sig: Place 1 drop into the right eye 4 (four) times daily for 7 days.    Dispense:  10 mL    Refill:  0  . prednisoLONE acetate (PRED  FORTE) 1 % ophthalmic suspension    Sig: Place 1 drop into the right eye 4 (four) times daily for 7 days.    Dispense:  10 mL    Refill:  0       Return for 3-4 wks, POV s/p Laser retinopexy OD.  There are no Patient Instructions on file for this visit.   Explained the diagnoses, plan, and follow up with the patient and they expressed understanding.  Patient expressed understanding of the importance of proper follow up care.   This document serves as a record of services personally performed by Karie Chimera, MD, PhD. It was created on their behalf by Laurian Brim, OA, an ophthalmic assistant. The creation of this record is the provider's dictation and/or activities during the visit.    Electronically signed by: Laurian Brim, OA  01.05.2020 12:32 PM    Karie Chimera, M.D., Ph.D. Diseases & Surgery of the Retina and Vitreous [facility]   I have reviewed the above documentation for accuracy and completeness, and I agree with the above. Karie Chimera, M.D., Ph.D. 08/02/18 12:32 PM    Abbreviations: M myopia (nearsighted); A astigmatism; H hyperopia (farsighted); P presbyopia; Mrx spectacle prescription;  CTL contact lenses; OD right eye; OS left eye; OU both eyes  XT exotropia; ET esotropia;  PEK punctate epithelial keratitis; PEE punctate epithelial erosions; DES dry eye syndrome; MGD meibomian gland dysfunction; ATs artificial tears; PFAT's preservative free artificial tears; NSC nuclear sclerotic cataract; PSC posterior subcapsular cataract; ERM epi-retinal membrane; PVD posterior vitreous detachment; RD retinal detachment; DM diabetes mellitus; DR diabetic retinopathy; NPDR non-proliferative diabetic retinopathy; PDR proliferative diabetic retinopathy; CSME clinically significant macular edema; DME diabetic macular edema; dbh dot blot hemorrhages; CWS cotton wool spot; POAG primary open angle glaucoma; C/D cup-to-disc ratio; HVF humphrey visual field; GVF  goldmann visual field; OCT optical coherence tomography; IOP intraocular pressure; BRVO Branch retinal vein occlusion; CRVO central retinal vein occlusion; CRAO central retinal artery occlusion; BRAO branch retinal artery occlusion; RT retinal tear; SB scleral buckle; PPV pars plana vitrectomy; VH Vitreous hemorrhage; PRP panretinal laser photocoagulation; IVK intravitreal kenalog; VMT vitreomacular traction; MH Macular hole;  NVD neovascularization of the disc; NVE neovascularization elsewhere; AREDS age related eye disease study; ARMD age related macular degeneration; POAG primary open angle glaucoma; EBMD epithelial/anterior basement membrane dystrophy; ACIOL anterior chamber intraocular lens; IOL intraocular lens; PCIOL posterior chamber intraocular lens; Phaco/IOL phacoemulsification with intraocular lens placement; PRK photorefractive keratectomy; LASIK laser assisted in situ keratomileusis; HTN hypertension; DM diabetes mellitus; COPD chronic obstructive pulmonary disease

## 2018-08-02 ENCOUNTER — Ambulatory Visit (INDEPENDENT_AMBULATORY_CARE_PROVIDER_SITE_OTHER): Payer: Medicare Other | Admitting: Ophthalmology

## 2018-08-02 ENCOUNTER — Encounter (INDEPENDENT_AMBULATORY_CARE_PROVIDER_SITE_OTHER): Payer: Self-pay | Admitting: Ophthalmology

## 2018-08-02 DIAGNOSIS — H25813 Combined forms of age-related cataract, bilateral: Secondary | ICD-10-CM

## 2018-08-02 DIAGNOSIS — H33301 Unspecified retinal break, right eye: Secondary | ICD-10-CM

## 2018-08-02 DIAGNOSIS — H43811 Vitreous degeneration, right eye: Secondary | ICD-10-CM

## 2018-08-02 DIAGNOSIS — H35461 Secondary vitreoretinal degeneration, right eye: Secondary | ICD-10-CM

## 2018-08-02 DIAGNOSIS — H3581 Retinal edema: Secondary | ICD-10-CM

## 2018-08-02 MED ORDER — PREDNISOLONE ACETATE 1 % OP SUSP: 1.0000 [drp] | Freq: Four times a day (QID) | OPHTHALMIC | 0 refills | Status: AC

## 2018-08-02 MED ORDER — PREDNISOLONE ACETATE 1 % OP SUSP: 1.0000 [drp] | Freq: Four times a day (QID) | OPHTHALMIC | 0 refills | Status: DC

## 2018-08-10 ENCOUNTER — Telehealth: Payer: Self-pay | Admitting: Allergy

## 2018-08-10 NOTE — Telephone Encounter (Signed)
Spoke with patient. She is aware that her chart does not say that she HAS it.  That doctor Delorse Lek put that in her discharge instructions as information piece.

## 2018-08-10 NOTE — Telephone Encounter (Signed)
Patient called and said she had and eye injury and she saw her eye doctor. She said when looked in her chart, I guess her my chart. She noticed that Dr. Delorse LekPadgett noted that she had an inherited edema of some sort. She wants to know why that is in her chart?

## 2018-08-16 ENCOUNTER — Encounter (INDEPENDENT_AMBULATORY_CARE_PROVIDER_SITE_OTHER): Payer: Self-pay | Admitting: Ophthalmology

## 2018-08-16 ENCOUNTER — Ambulatory Visit (INDEPENDENT_AMBULATORY_CARE_PROVIDER_SITE_OTHER): Payer: Medicare Other | Admitting: Ophthalmology

## 2018-08-16 DIAGNOSIS — H3581 Retinal edema: Secondary | ICD-10-CM

## 2018-08-16 DIAGNOSIS — H33301 Unspecified retinal break, right eye: Secondary | ICD-10-CM

## 2018-08-16 DIAGNOSIS — H25813 Combined forms of age-related cataract, bilateral: Secondary | ICD-10-CM

## 2018-08-16 DIAGNOSIS — H35461 Secondary vitreoretinal degeneration, right eye: Secondary | ICD-10-CM

## 2018-08-16 DIAGNOSIS — H43811 Vitreous degeneration, right eye: Secondary | ICD-10-CM

## 2018-08-16 NOTE — Progress Notes (Signed)
[facility] - Clinic Note  08/16/2018     CHIEF COMPLAINT Patient presents for Retina Follow Up   HISTORY OF PRESENT ILLNESS: Misty Burke is a 63 y.o. female who presents to the clinic today for:  Flashes/floaters OD  HPI    Retina Follow Up    In right eye.  This started 1 week ago.  Since onset it is stable.  I, the attending physician,  performed the HPI with the patient and updated documentation appropriately.          Comments    F/U Cystic retinal tuft OD S/P laser retinopexy OD (08/02/18). Patient states 1-2 days after she had laser OD,"I couldn't see, its like a dirty glass and I can't see through it" and c/o eye soreness. Pt also states the floaters looks like a "hair with a hook at the bottom of it". Pt completed PF as instructed.       Last edited by Rennis Chris, MD on 08/16/2018  1:40 PM. (History)    pt states after she received laser at last visit she started seeing "thousands" of black floaters, she states she also saw a "veil" temporally and flashes of light  Referring physician: Shade Flood, MD [address] [place], [place] [address]  HISTORICAL INFORMATION:   Selected notes from the MEDICAL RECORD NUMBER    CURRENT MEDICATIONS: No current outpatient medications on file. (Ophthalmic Drugs)   No current facility-administered medications for this visit.  (Ophthalmic Drugs)   Current Outpatient Medications (Other)  Medication Sig  . fexofenadine (ALLEGRA) 180 MG tablet Take 180 mg by mouth daily.  . fluticasone (FLONASE) 50 MCG/ACT nasal spray Place 1-2 sprays into both nostrils at bedtime.  Marland Kitchen ipratropium (ATROVENT) 0.06 % nasal spray Place 1-2 sprays into both nostrils 4 (four) times daily as needed for rhinitis.   No current facility-administered medications for this visit.  (Other)      REVIEW OF SYSTEMS: ROS    Positive for: Eyes   Negative for: Constitutional, Gastrointestinal, Neurological, Skin, Genitourinary,  Musculoskeletal, HENT, Endocrine, Cardiovascular, Respiratory, Psychiatric, Allergic/Imm, Heme/Lymph   Last edited by Eldridge Scot, LPN on 03/25/5620  1:21 PM. (History)       ALLERGIES No Known Allergies  PAST MEDICAL HISTORY Past Medical History:  Diagnosis Date  . Angio-edema   . Depression   . Urticaria    Past Surgical History:  Procedure Laterality Date  . BREAST CYST ASPIRATION Left   . NO PAST SURGERIES      FAMILY HISTORY Family History  Problem Relation Age of Onset  . Cancer Other   . Cancer Father   . Diabetes Neg Hx   . Hypertension Neg Hx   . Breast cancer Neg Hx     SOCIAL HISTORY Social History   Tobacco Use  . Smoking status: Never Smoker  . Smokeless tobacco: Never Used  Substance Use Topics  . Alcohol use: Yes  . Drug use: No         OPHTHALMIC EXAM:  Base Eye Exam    Visual Acuity (Snellen - Linear)      Right Left   Dist [place] 20/20 -1 20/20 -1   Dist ph [place] NI NI       Tonometry (Tonopen, 1:20 PM)      Right Left   Pressure 18 16       Pupils      Dark Light Shape React APD   Right 3 2 Round  Brisk None   Left 3 2 Round Brisk None       Visual Fields (Counting fingers)      Left Right    Full Full       Extraocular Movement      Right Left    Full, Ortho Full, Ortho       Neuro/Psych    Oriented x3:  Yes   Mood/Affect:  Normal       Dilation    Both eyes:  1.0% Mydriacyl, 2.5% Phenylephrine @ 1:15 PM        Slit Lamp and Fundus Exam    External Exam      Right Left   External Normal Normal       Slit Lamp Exam      Right Left   Lids/Lashes Normal; mild fat prolapse inf lid Normal   Conjunctiva/Sclera White and quiet White and quiet   Cornea Clear Clear   Anterior Chamber Deep and quiet Deep and quiet   Iris Round and reactive Round and reactive   Lens 2+ NSC, 2+ CC 2+ NSC, 2+ CC   Vitreous Vitreous syneresis, Posterior vitreous detachment syneresis       Fundus Exam      Right Left   Disc  Normal Normal   C/D Ratio 0.3 0.4   Macula flat; good foveal reflex, Retinal pigment epithelial mottling, No heme or edema flat; good foveal reflex   Vessels Vascular attenuation Normal   Periphery attached; VR tufts at 0600 and 1100 equator -- great laser surrounding attached          IMAGING AND PROCEDURES  Imaging and Procedures for 07/31/18  OCT, Retina - OU - Both Eyes       Right Eye Quality was good. Central Foveal Thickness: 254. Progression has been stable. Findings include normal foveal contour, no IRF, no SRF (Mild vitreous opacitites).   Left Eye Quality was good. Central Foveal Thickness: 246. Progression has been stable. Findings include normal foveal contour, no IRF, no SRF (Partial PVD, inferior inner-retinoschisis caught on widefield).   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 CME - cystoid macular edema. PED - pigment epithelial detachment. IRF - intraretinal fluid. SRF - subretinal fluid. EZ - ellipsoid zone. ERM - epiretinal membrane. ORA - outer retinal atrophy. ORT - outer retinal tubulation. SRHM - subretinal hyper-reflective material                 ASSESSMENT/PLAN:    ICD-10-CM   1. 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 Retinal break, right H33.301   3. Posterior vitreous detachment of right eye H43.811   4. Combined forms of age-related cataract of both eyes H25.813   5. Retinal edema H35.81 OCT, Retina - OU - Both Eyes    1. VR tufts w/ retinal breaks OD  Cystic retinal tufts at 1100 and 0600 equator   Likely etiology of intermittent photopsias  S/p laser retinopexy OD (01.06.20) -- great laser surrounding both areas  Finished PF QID OD x7 days  F/u 4 wks  2. PVD / vitreous syneresis OU  OD symptomatic with  flashes/floaters x2 wks -- was seen by Dr. Maple HudsonYoung on call prior to Xmas 2019  Discussed findings and prognosis  No other RT  or RD on 360 scleral depressed exam  Reviewed s/s of RT/RD  Strict return precautions for any such RT/RD signs/symptoms  3. Combined cataract OU - The symptoms of cataract, surgical options, and treatments and risks were discussed with patient. - discussed diagnosis and progression - not yet visually significant - monitor for now   Ophthalmic Meds Ordered this visit:  No orders of the defined types were placed in this encounter.      Return in about 4 weeks (around 09/13/2018) for POV.  There are no Patient Instructions on file for this visit.   Explained the diagnoses, plan, and follow up with the patient and they expressed understanding.  Patient expressed understanding of the importance of proper follow up care.   This document serves as a record of services personally performed by Karie ChimeraBrian G. Kinslei Labine, MD, PhD. It was created on their behalf by Laurian BrimAmanda Brown, OA, an ophthalmic assistant. The creation of this record is the provider's dictation and/or activities during the visit.    Electronically signed by: Laurian BrimAmanda Brown, OA  01.20.2020 2:31 PM    Karie ChimeraBrian G. Sinan Tuch, M.D., Ph.D. Diseases & Surgery of the Retina and Vitreous [facility]  I have reviewed the above documentation for accuracy and completeness, and I agree with the above. Karie ChimeraBrian G. Emilija Bohman, M.D., Ph.D. 08/16/18 2:31 PM    Abbreviations: M myopia (nearsighted); A astigmatism; H hyperopia (farsighted); P presbyopia; Mrx spectacle prescription;  CTL contact lenses; OD right eye; OS left eye; OU both eyes  XT exotropia; ET esotropia; PEK punctate epithelial keratitis; PEE punctate epithelial erosions; DES dry eye syndrome; MGD meibomian gland dysfunction; ATs artificial tears; PFAT's preservative free artificial tears; NSC nuclear sclerotic cataract; PSC posterior subcapsular cataract;  ERM epi-retinal membrane; PVD posterior vitreous detachment; RD retinal detachment; DM diabetes mellitus; DR diabetic retinopathy; NPDR non-proliferative diabetic retinopathy; PDR proliferative diabetic retinopathy; CSME clinically significant macular edema; DME diabetic macular edema; dbh dot blot hemorrhages; CWS cotton wool spot; POAG primary open angle glaucoma; C/D cup-to-disc ratio; HVF humphrey visual field; GVF goldmann visual field; OCT optical coherence tomography; IOP intraocular pressure; BRVO Branch retinal vein occlusion; CRVO central retinal vein occlusion; CRAO central retinal artery occlusion; BRAO branch retinal artery occlusion; RT retinal tear; SB scleral buckle; PPV pars plana vitrectomy; VH Vitreous hemorrhage; PRP panretinal laser photocoagulation; IVK intravitreal kenalog; VMT vitreomacular traction; MH Macular hole;  NVD neovascularization of the disc; NVE neovascularization elsewhere; AREDS age related eye disease study; ARMD age related macular degeneration; POAG primary open angle glaucoma; EBMD epithelial/anterior basement membrane dystrophy; ACIOL anterior chamber intraocular lens; IOL intraocular lens; PCIOL posterior chamber intraocular lens; Phaco/IOL phacoemulsification with intraocular lens placement; PRK photorefractive keratectomy; LASIK laser assisted in situ keratomileusis; HTN hypertension; DM diabetes mellitus; COPD chronic obstructive pulmonary disease

## 2018-08-23 ENCOUNTER — Encounter (INDEPENDENT_AMBULATORY_CARE_PROVIDER_SITE_OTHER): Payer: Medicare Other | Admitting: Ophthalmology

## 2018-09-12 NOTE — Progress Notes (Signed)
[facility] - Clinic Note  09/13/2018     CHIEF COMPLAINT Patient presents for Flashes/floaters; Retina Follow Up; and Post-op Follow-up   HISTORY OF PRESENT ILLNESS: Misty Burke is a 63 y.o. female who presents to the clinic today for:  Flashes/floaters OD  HPI    Flashes/floaters    In right eye.  Duration of 4 weeks.  Duration Constant.  Characterized as large.  Since onset it is stable.  Associated Symptoms Flashes and Floaters.  Negative for Shoulder/Hip pain, Glare, Blind Spot, Fatigue, Jaw Claudication, Photophobia, Scalp Tenderness, Redness, Pain, Trauma, Fever, Weight Loss and Distortion.  Context:  distance vision.  Treatments tried include laser.  Response to treatment was no improvement.  I, the attending physician,  performed the HPI with the patient and updated documentation appropriately.          Retina Follow Up    Patient presents with  Retinal Break/Detachment.  In right eye.  Duration of 4 weeks.  Since onset it is stable.  I, the attending physician,  performed the HPI with the patient and updated documentation appropriately.          Post-op Follow-up    In right eye.  Discomfort includes floaters.  Negative for pain, itching, foreign body sensation, tearing, discharge and none.  Vision is stable.  I, the attending physician,  performed the HPI with the patient and updated documentation appropriately.          Comments    4 week follow up for VR Tufts with retinal breaks od. Patient states i'ts no better. Still looks like a ball of hair in od. Feels like eye lid is swollen.       Last edited by Rennis ChrisZamora, Owynn Mosqueda, MD on 09/14/2018  9:14 AM. (History)    pt states her right eye vision has not improved, she states when she looks down her vision is 20/20, but when she looks straight ahead her vision is obstructed, she states it looks like a bunch of translucent hair in her vision, she states she often has to patch her right eye bc her left  eye eye vision is great, she states she also frequently wears sunglasses even inside bc of glare  Referring physician: Shade FloodGreene, Jeffrey R, MD [address] [place], [place] [id number]  HISTORICAL INFORMATION:   Selected notes from the MEDICAL RECORD NUMBER    CURRENT MEDICATIONS: No current outpatient medications on file. (Ophthalmic Drugs)   No current facility-administered medications for this visit.  (Ophthalmic Drugs)   Current Outpatient Medications (Other)  Medication Sig  . fexofenadine (ALLEGRA) 180 MG tablet Take 180 mg by mouth daily.  . fluticasone (FLONASE) 50 MCG/ACT nasal spray Place 1-2 sprays into both nostrils at bedtime.  Marland Kitchen. ipratropium (ATROVENT) 0.06 % nasal spray Place 1-2 sprays into both nostrils 4 (four) times daily as needed for rhinitis.   No current facility-administered medications for this visit.  (Other)      REVIEW OF SYSTEMS: ROS    Positive for: Eyes   Negative for: Constitutional, Gastrointestinal, Neurological, Skin, Genitourinary, Musculoskeletal, HENT, Endocrine, Cardiovascular, Respiratory, Psychiatric, Allergic/Imm, Heme/Lymph   Last edited by Lana FishAtkins, Lisa E on 09/13/2018  2:34 PM. (History)       ALLERGIES No Known Allergies  PAST MEDICAL HISTORY Past Medical History:  Diagnosis Date  . Angio-edema   . Depression   . Urticaria    Past Surgical History:  Procedure Laterality Date  . BREAST CYST ASPIRATION Left   .  NO PAST SURGERIES      FAMILY HISTORY Family History  Problem Relation Age of Onset  . Cancer Other   . Cancer Father   . Diabetes Neg Hx   . Hypertension Neg Hx   . Breast cancer Neg Hx     SOCIAL HISTORY Social History   Tobacco Use  . Smoking status: Never Smoker  . Smokeless tobacco: Never Used  Substance Use Topics  . Alcohol use: Yes  . Drug use: No         OPHTHALMIC EXAM:  Base Eye Exam    Visual Acuity (Snellen - Linear)      Right Left   Dist  20/20-1 20/20       Tonometry (Tonopen,  2:46 PM)      Right Left   Pressure 16 23       Pupils      Dark Light Shape React APD   Right 2 1 Round Brisk None   Left 2 1 Round Brisk None       Visual Fields (Counting fingers)      Left Right    Full Full       Extraocular Movement      Right Left    Ortho Ortho    -- -- --  --  --  -- -- --   -- -- --  --  --  -- -- --         Neuro/Psych    Oriented x3:  Yes   Mood/Affect:  Normal       Dilation    Both eyes:  1.0% Mydriacyl, 2.5% Phenylephrine @ 2:46 PM        Slit Lamp and Fundus Exam    External Exam      Right Left   External Normal Normal       Slit Lamp Exam      Right Left   Lids/Lashes Normal; mild fat prolapse inf lid Normal   Conjunctiva/Sclera White and quiet White and quiet   Cornea Clear Clear   Anterior Chamber Deep and quiet Deep and quiet   Iris Round and reactive Round and reactive   Lens 2+ NSC, 2+ CC 2+ NSC, 2+ CC   Vitreous Vitreous syneresis, Posterior vitreous detachment, Weiss ring syneresis       Fundus Exam      Right Left   Disc Pink and Sharp Pink and Sharp   C/D Ratio 0.3 0.4   Macula flat; good foveal reflex, Retinal pigment epithelial mottling, No heme or edema flat; good foveal reflex   Vessels Vascular attenuation Normal   Periphery attached; VR tufts with opercula at 0600 and 1100 equator -- great laser surrounding attached          IMAGING AND PROCEDURES  Imaging and Procedures for 07/31/18  OCT, Retina - OU - Both Eyes       Right Eye Quality was good. Central Foveal Thickness: 254. Progression has been stable. Findings include normal foveal contour, no IRF, no SRF (Mild vitreous opacitites).   Left Eye Quality was good. Central Foveal Thickness: 247. Progression has been stable. Findings include normal foveal contour, no IRF, no SRF (Partial PVD, inferior inner-retinoschisis caught on widefield - stable).   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 CME - cystoid macular edema. PED - pigment epithelial detachment. IRF - intraretinal fluid. SRF - subretinal fluid. EZ - ellipsoid zone. ERM - epiretinal membrane. ORA - outer retinal atrophy. ORT - outer retinal tubulation. SRHM - subretinal hyper-reflective material                 ASSESSMENT/PLAN:    ICD-10-CM   1. 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 Retinal break, right H33.301   3. Posterior vitreous detachment of right eye H43.811   4. Combined forms of age-related cataract of both eyes H25.813   5. Retinal edema H35.81 OCT, Retina - OU - Both Eyes    1. VR tufts w/ retinal breaks OD  Cystic retinal tufts at 1100 and 0600 equator   Likely etiology of intermittent photopsias  S/p laser retinopexy OD (01.06.20) -- great laser surrounding both areas  F/u 3 months  2. PVD / vitreous syneresis OU  OD symptomatic with flashes/floaters x2 wks -- was seen by Dr. Maple Hudson on call prior to Xmas 2019  Discussed findings and prognosis  No other RT or RD on 360 scleral depressed exam  Reviewed s/s of RT/RD  Strict return precautions for any such RT/RD signs/symptoms  3. Combined cataract OU - The symptoms of cataract, surgical options, and treatments and risks were discussed with patient. - discussed diagnosis and progression - not yet visually significant - monitor for now   Ophthalmic Meds Ordered this visit:  No orders of the defined types were placed in this encounter.      Return in about 3 months (around 12/12/2018) for f/u VR tufts / retinal breaks OD, DFE, OCT.  There are no Patient Instructions on file for this visit.   Explained the diagnoses, plan, and follow up with the patient and they expressed understanding.  Patient expressed understanding of the importance of proper follow  up care.   This document serves as a record of services personally performed by Karie Chimera, MD, PhD. It was created on their behalf by Laurian Brim, OA, an ophthalmic assistant. The creation of this record is the provider's dictation and/or activities during the visit.    Electronically signed by: Laurian Brim, OA  02.16.2020 9:15 AM    Karie Chimera, M.D., Ph.D. Diseases & Surgery of the Retina and Vitreous [facility]  I have reviewed the above documentation for accuracy and completeness, and I agree with the above. Karie Chimera, M.D., Ph.D. 09/14/18 9:15 AM     Abbreviations: M myopia (nearsighted); A astigmatism; H hyperopia (farsighted); P presbyopia; Mrx spectacle prescription;  CTL contact lenses; OD right eye; OS left eye; OU both eyes  XT exotropia; ET esotropia; PEK punctate epithelial keratitis; PEE punctate epithelial erosions; DES dry eye syndrome; MGD meibomian gland dysfunction; ATs artificial tears; PFAT's preservative free artificial tears; NSC nuclear sclerotic cataract; PSC posterior subcapsular cataract; ERM epi-retinal membrane; PVD posterior vitreous detachment; RD retinal detachment; DM diabetes mellitus; DR diabetic retinopathy; NPDR non-proliferative diabetic retinopathy; PDR proliferative diabetic retinopathy; CSME clinically significant macular edema; DME diabetic macular edema; dbh dot blot hemorrhages; CWS cotton wool spot; POAG primary open angle glaucoma; C/D cup-to-disc ratio; HVF humphrey visual field; GVF goldmann visual field; OCT optical coherence tomography; IOP intraocular pressure; BRVO Branch retinal vein occlusion; CRVO central retinal vein occlusion; CRAO central retinal artery occlusion; BRAO branch retinal artery occlusion; RT retinal tear; SB scleral buckle; PPV pars plana vitrectomy; VH Vitreous hemorrhage;  PRP panretinal laser photocoagulation; IVK intravitreal kenalog; VMT vitreomacular traction; MH Macular hole;  NVD  neovascularization of the disc; NVE neovascularization elsewhere; AREDS age related eye disease study; ARMD age related macular degeneration; POAG primary open angle glaucoma; EBMD epithelial/anterior basement membrane dystrophy; ACIOL anterior chamber intraocular lens; IOL intraocular lens; PCIOL posterior chamber intraocular lens; Phaco/IOL phacoemulsification with intraocular lens placement; PRK photorefractive keratectomy; LASIK laser assisted in situ keratomileusis; HTN hypertension; DM diabetes mellitus; COPD chronic obstructive pulmonary disease

## 2018-09-13 ENCOUNTER — Ambulatory Visit (INDEPENDENT_AMBULATORY_CARE_PROVIDER_SITE_OTHER): Payer: Medicare Other | Admitting: Ophthalmology

## 2018-09-13 ENCOUNTER — Encounter (INDEPENDENT_AMBULATORY_CARE_PROVIDER_SITE_OTHER): Payer: Self-pay | Admitting: Ophthalmology

## 2018-09-13 DIAGNOSIS — H3581 Retinal edema: Secondary | ICD-10-CM | POA: Diagnosis not present

## 2018-09-13 DIAGNOSIS — H35461 Secondary vitreoretinal degeneration, right eye: Secondary | ICD-10-CM

## 2018-09-13 DIAGNOSIS — H33301 Unspecified retinal break, right eye: Secondary | ICD-10-CM

## 2018-09-13 DIAGNOSIS — H25813 Combined forms of age-related cataract, bilateral: Secondary | ICD-10-CM

## 2018-09-13 DIAGNOSIS — H43811 Vitreous degeneration, right eye: Secondary | ICD-10-CM

## 2018-09-14 ENCOUNTER — Encounter (INDEPENDENT_AMBULATORY_CARE_PROVIDER_SITE_OTHER): Payer: Self-pay | Admitting: Ophthalmology

## 2018-10-14 ENCOUNTER — Ambulatory Visit: Payer: Medicare Other | Admitting: Allergy

## 2018-10-15 ENCOUNTER — Ambulatory Visit: Payer: Medicare Other | Admitting: Allergy

## 2018-12-05 NOTE — Progress Notes (Signed)
[facility] - Clinic Note  12/06/2018     CHIEF COMPLAINT Patient presents for Retina Follow Up   HISTORY OF PRESENT ILLNESS: Misty Burke is a 63 y.o. female who presents to the clinic today for:  Flashes/floaters OD  HPI    Retina Follow Up    Patient presents with  Retinal Break/Detachment.  In right eye.  I, the attending physician,  performed the HPI with the patient and updated documentation appropriately.          Comments    3 month retinal eval; Pt states she still has a "ball of hair with hook" in vision, pt states she sees flashes mostly at night       Last edited by Rennis ChrisZamora, Infantof Villagomez, MD on 12/07/2018  8:50 PM. (History)    pt states she is still seeing the same floater that she was sent here for several months ago, pt denies flashes/floaters OS   Referring physician: Shade FloodGreene, Jeffrey R, MD [address] [place], [place] [id number]  HISTORICAL INFORMATION:   Selected notes from the MEDICAL RECORD NUMBER    CURRENT MEDICATIONS: No current outpatient medications on file. (Ophthalmic Drugs)   No current facility-administered medications for this visit.  (Ophthalmic Drugs)   Current Outpatient Medications (Other)  Medication Sig  . fexofenadine (ALLEGRA) 180 MG tablet Take 180 mg by mouth daily.  . fluticasone (FLONASE) 50 MCG/ACT nasal spray Place 1-2 sprays into both nostrils at bedtime.  Marland Kitchen. ipratropium (ATROVENT) 0.06 % nasal spray Place 1-2 sprays into both nostrils 4 (four) times daily as needed for rhinitis.   No current facility-administered medications for this visit.  (Other)      REVIEW OF SYSTEMS: ROS    Positive for: Eyes   Negative for: Constitutional, Gastrointestinal, Neurological, Skin, Genitourinary, Musculoskeletal, HENT, Endocrine, Cardiovascular, Respiratory, Psychiatric, Heme/Lymph   Last edited by Posey BoyerBrown, Amanda J, COT on 12/06/2018  2:51 PM. (History)       ALLERGIES No Known Allergies  PAST MEDICAL HISTORY Past  Medical History:  Diagnosis Date  . Angio-edema   . Depression   . Urticaria    Past Surgical History:  Procedure Laterality Date  . BREAST CYST ASPIRATION Left   . NO PAST SURGERIES      FAMILY HISTORY Family History  Problem Relation Age of Onset  . Cancer Other   . Cancer Father   . Diabetes Neg Hx   . Hypertension Neg Hx   . Breast cancer Neg Hx     SOCIAL HISTORY Social History   Tobacco Use  . Smoking status: Never Smoker  . Smokeless tobacco: Never Used  Substance Use Topics  . Alcohol use: Yes  . Drug use: No         OPHTHALMIC EXAM:  Base Eye Exam    Visual Acuity (Snellen - Linear)      Right Left   Dist [place] 20/20 -2 20/20       Tonometry (Tonopen, 2:06 PM)      Right Left   Pressure 16 19       Pupils      Dark Light React APD   Right 2 1 Slow None   Left 2 1 Slow None       Visual Fields (Toys)      Left Right    Full Full       Extraocular Movement      Right Left    Full, Ortho Full, Ortho  Neuro/Psych    Oriented x3:  Yes   Mood/Affect:  Normal       Dilation    Both eyes:  1.0% Mydriacyl, 2.5% Phenylephrine @ 2:06 PM        Slit Lamp and Fundus Exam    External Exam      Right Left   External Normal Normal       Slit Lamp Exam      Right Left   Lids/Lashes Normal; mild fat prolapse inf lid Normal   Conjunctiva/Sclera White and quiet White and quiet   Cornea Arcus, 1+ Punctate epithelial erosions Arcus, 1+ Punctate epithelial erosions   Anterior Chamber Deep and quiet Deep and quiet   Iris Round and moderately dilated to 5.5 Round and moderately dilated to 5.29mm   Lens 2+ NSC, 2+ CC 2+ NSC, 2+ CC   Vitreous Vitreous syneresis, Posterior vitreous detachment, Weiss ring, vitreous condensations syneresis       Fundus Exam      Right Left   Disc Pink and Sharp Pink and Sharp   C/D Ratio 0.3 0.4   Macula flat; good foveal reflex, Retinal pigment epithelial mottling, No heme or edema flat; good foveal reflex,  No heme or edema   Vessels Vascular attenuation mild Vascular attenuation, mild AV crossing changes   Periphery attached; VR tufts with opercula at 0600 and 1100 equator -- great laser surrounding attached          IMAGING AND PROCEDURES  Imaging and Procedures for 07/31/18  OCT, Retina - OU - Both Eyes       Right Eye Quality was good. Central Foveal Thickness: 260. Progression has been stable. Findings include normal foveal contour, no IRF, no SRF.   Left Eye Quality was good. Central Foveal Thickness: 247. Progression has been stable. Findings include normal foveal contour, no IRF, no SRF (Partial PVD, inferior inner-retinoschisis caught on widefield - stable).   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 CME - cystoid macular edema. PED - pigment epithelial detachment. IRF - intraretinal fluid. SRF - subretinal fluid. EZ - ellipsoid zone. ERM - epiretinal membrane. ORA - outer retinal atrophy. ORT - outer retinal tubulation. SRHM - subretinal hyper-reflective material                 ASSESSMENT/PLAN:    ICD-10-CM   1. 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 Retinal break, right H33.301   3. Posterior vitreous detachment of right eye H43.811   4. Combined forms of age-related cataract of both eyes H25.813   5. Retinal edema H35.81 OCT, Retina - OU - Both Eyes    1,2. VR tufts w/ retinal breaks OD  - Cystic retinal tufts at 1100 and 0600 equator   - Likely etiology of intermittent photopsias  - S/p laser retinopexy OD (01.06.20) -- great laser surrounding both areas  - F/u 9 months  3. PVD / vitreous syneresis OU  - OD symptomatic with flashes/floaters x2 wks -- was seen by Dr. Maple Hudson on call prior to Xmas 2019  - Discussed findings and  prognosis  - No other RT or RD on 360 scleral depressed exam  - Reviewed s/s of RT/RD  - Strict return precautions for any such RT/RD signs/symptoms  4. Combined cataract  OU  - The symptoms of cataract, surgical options, and treatments and risks were discussed with patient.  - discussed diagnosis and progression  - not yet visually significant  - monitor for now  5. No retinal edema on exam or OCT   Ophthalmic Meds Ordered this visit:  No orders of the defined types were placed in this encounter.      Return in about 9 months (around 09/08/2019) for f/u VR tufts OD, DFE, OCT.  There are no Patient Instructions on file for this visit.   Explained the diagnoses, plan, and follow up with the patient and they expressed understanding.  Patient expressed understanding of the importance of proper follow up care.   This document serves as a record of services personally performed by Karie Chimera, MD, PhD. It was created on their behalf by Laurian Brim, OA, an ophthalmic assistant. The creation of this record is the provider's dictation and/or activities during the visit.    Electronically signed by: Laurian Brim, OA  05.10.2020 8:51 PM    Karie Chimera, M.D., Ph.D. Diseases & Surgery of the Retina and Vitreous [facility]  I have reviewed the above documentation for accuracy and completeness, and I agree with the above. Karie Chimera, M.D., Ph.D. 12/07/18 8:52 PM    Abbreviations: M myopia (nearsighted); A astigmatism; H hyperopia (farsighted); P presbyopia; Mrx spectacle prescription;  CTL contact lenses; OD right eye; OS left eye; OU both eyes  XT exotropia; ET esotropia; PEK punctate epithelial keratitis; PEE punctate epithelial erosions; DES dry eye syndrome; MGD meibomian gland dysfunction; ATs artificial tears; PFAT's preservative free artificial tears; NSC nuclear sclerotic cataract; PSC posterior subcapsular cataract; ERM epi-retinal membrane; PVD  posterior vitreous detachment; RD retinal detachment; DM diabetes mellitus; DR diabetic retinopathy; NPDR non-proliferative diabetic retinopathy; PDR proliferative diabetic retinopathy; CSME clinically significant macular edema; DME diabetic macular edema; dbh dot blot hemorrhages; CWS cotton wool spot; POAG primary open angle glaucoma; C/D cup-to-disc ratio; HVF humphrey visual field; GVF goldmann visual field; OCT optical coherence tomography; IOP intraocular pressure; BRVO Branch retinal vein occlusion; CRVO central retinal vein occlusion; CRAO central retinal artery occlusion; BRAO branch retinal artery occlusion; RT retinal tear; SB scleral buckle; PPV pars plana vitrectomy; VH Vitreous hemorrhage; PRP panretinal laser photocoagulation; IVK intravitreal kenalog; VMT vitreomacular traction; MH Macular hole;  NVD neovascularization of the disc; NVE neovascularization elsewhere; AREDS age related eye disease study; ARMD age related macular degeneration; POAG primary open angle glaucoma; EBMD epithelial/anterior basement membrane dystrophy; ACIOL anterior chamber intraocular lens; IOL intraocular lens; PCIOL posterior chamber intraocular lens; Phaco/IOL phacoemulsification with intraocular lens placement; PRK photorefractive keratectomy; LASIK laser assisted in situ keratomileusis; HTN hypertension; DM diabetes mellitus; COPD chronic obstructive pulmonary disease

## 2018-12-06 ENCOUNTER — Encounter (INDEPENDENT_AMBULATORY_CARE_PROVIDER_SITE_OTHER): Payer: Self-pay | Admitting: Ophthalmology

## 2018-12-06 ENCOUNTER — Ambulatory Visit (INDEPENDENT_AMBULATORY_CARE_PROVIDER_SITE_OTHER): Payer: Medicare Other | Admitting: Ophthalmology

## 2018-12-06 ENCOUNTER — Other Ambulatory Visit: Payer: Self-pay

## 2018-12-06 DIAGNOSIS — H35461 Secondary vitreoretinal degeneration, right eye: Secondary | ICD-10-CM

## 2018-12-06 DIAGNOSIS — H3581 Retinal edema: Secondary | ICD-10-CM

## 2018-12-06 DIAGNOSIS — H43811 Vitreous degeneration, right eye: Secondary | ICD-10-CM | POA: Diagnosis not present

## 2018-12-06 DIAGNOSIS — H25813 Combined forms of age-related cataract, bilateral: Secondary | ICD-10-CM

## 2018-12-06 DIAGNOSIS — H33301 Unspecified retinal break, right eye: Secondary | ICD-10-CM | POA: Diagnosis not present

## 2018-12-07 ENCOUNTER — Encounter (INDEPENDENT_AMBULATORY_CARE_PROVIDER_SITE_OTHER): Payer: Self-pay | Admitting: Ophthalmology

## 2019-03-17 ENCOUNTER — Telehealth: Payer: Medicare Other | Admitting: Family Medicine

## 2019-03-25 ENCOUNTER — Other Ambulatory Visit (HOSPITAL_COMMUNITY)
Admission: RE | Admit: 2019-03-25 | Discharge: 2019-03-25 | Disposition: A | Discharge status: Home / Self Care | Payer: Medicare Other | Source: Ambulatory Visit | Attending: Family Medicine | Admitting: Family Medicine

## 2019-03-25 ENCOUNTER — Other Ambulatory Visit: Payer: Self-pay

## 2019-03-25 ENCOUNTER — Ambulatory Visit (INDEPENDENT_AMBULATORY_CARE_PROVIDER_SITE_OTHER): Payer: Medicare Other | Admitting: Family Medicine

## 2019-03-25 ENCOUNTER — Encounter: Payer: Self-pay | Admitting: Family Medicine

## 2019-03-25 ENCOUNTER — Other Ambulatory Visit: Payer: Self-pay | Admitting: Family Medicine

## 2019-03-25 ENCOUNTER — Ambulatory Visit (INDEPENDENT_AMBULATORY_CARE_PROVIDER_SITE_OTHER): Payer: Medicare Other

## 2019-03-25 VITALS — BP 112/76 | HR 71 | Temp 98.7°F | Resp 17 | Ht 69.0 in | Wt 242.6 lb

## 2019-03-25 DIAGNOSIS — R110 Nausea: Secondary | ICD-10-CM

## 2019-03-25 DIAGNOSIS — Z124 Encounter for screening for malignant neoplasm of cervix: Secondary | ICD-10-CM | POA: Insufficient documentation

## 2019-03-25 DIAGNOSIS — Z13220 Encounter for screening for lipoid disorders: Secondary | ICD-10-CM

## 2019-03-25 DIAGNOSIS — Z23 Encounter for immunization: Secondary | ICD-10-CM

## 2019-03-25 DIAGNOSIS — F439 Reaction to severe stress, unspecified: Secondary | ICD-10-CM

## 2019-03-25 DIAGNOSIS — R140 Abdominal distension (gaseous): Secondary | ICD-10-CM | POA: Diagnosis not present

## 2019-03-25 DIAGNOSIS — Z113 Encounter for screening for infections with a predominantly sexual mode of transmission: Secondary | ICD-10-CM

## 2019-03-25 DIAGNOSIS — F32A Depression, unspecified: Secondary | ICD-10-CM

## 2019-03-25 DIAGNOSIS — R195 Other fecal abnormalities: Secondary | ICD-10-CM

## 2019-03-25 DIAGNOSIS — Z1159 Encounter for screening for other viral diseases: Secondary | ICD-10-CM

## 2019-03-25 DIAGNOSIS — K625 Hemorrhage of anus and rectum: Secondary | ICD-10-CM

## 2019-03-25 DIAGNOSIS — F329 Major depressive disorder, single episode, unspecified: Secondary | ICD-10-CM

## 2019-03-25 DIAGNOSIS — Z0001 Encounter for general adult medical examination with abnormal findings: Secondary | ICD-10-CM | POA: Diagnosis not present

## 2019-03-25 DIAGNOSIS — R0789 Other chest pain: Secondary | ICD-10-CM

## 2019-03-25 DIAGNOSIS — Z114 Encounter for screening for human immunodeficiency virus [HIV]: Secondary | ICD-10-CM

## 2019-03-25 DIAGNOSIS — Z0000 Encounter for general adult medical examination without abnormal findings: Secondary | ICD-10-CM

## 2019-03-25 LAB — POCT URINALYSIS DIP (MANUAL ENTRY)
Bilirubin, UA: NEGATIVE
Blood, UA: NEGATIVE
Glucose, UA: NEGATIVE mg/dL
Ketones, POC UA: NEGATIVE mg/dL
Leukocytes, UA: NEGATIVE
Nitrite, UA: NEGATIVE
Protein Ur, POC: NEGATIVE mg/dL
Spec Grav, UA: >=1.03 — AB (ref 1.010–1.025)
Urobilinogen, UA: 0.2 E.U./dL
pH, UA: 5.5 (ref 5.0–8.0)

## 2019-03-25 LAB — POCT CBC
Granulocyte percent: 69 %G (ref 37–80)
HCT, POC: 41.9 % — AB (ref 29–41)
Hemoglobin: 12.7 g/dL (ref 11–14.6)
Lymph, poc: 2.4 (ref 0.6–3.4)
MCH, POC: 27.7 pg (ref 27–31.2)
MCHC: 32.6 g/dL (ref 31.8–35.4)
MCV: 85 fL (ref 76–111)
MID (cbc): 0.8 (ref 0–0.9)
MPV: 11.9 fL (ref 0–99.8)
POC Granulocyte: 7.1 — AB (ref 2–6.9)
POC LYMPH PERCENT: 23.2 %L (ref 10–50)
POC MID %: 7.8 %M (ref 0–12)
Platelet Count, POC: 209 10*3/uL (ref 142–424)
RBC: 4.93 M/uL (ref 4.04–5.48)
RDW, POC: 15.4 %
WBC: 10.3 10*3/uL — AB (ref 4.6–10.2)

## 2019-03-25 MED ORDER — OMEPRAZOLE 20 MG PO CPDR: 20.0000 mg | DELAYED_RELEASE_CAPSULE | Freq: Every day | ORAL | 1 refills | Status: DC

## 2019-03-25 NOTE — Patient Instructions (Addendum)
I am concerned that your depression is worse, and stressors are affecting you more. I do recommend call your previous therapist to follow up.  Here is another number if needed.  See information on stress below. I usually would recommend medication as well, but let me know if you change your mind about medication. Follow up in next 3 weeks to discuss these symptoms further Return to the clinic or go to the nearest emergency room if any of your symptoms worsen or new symptoms occur.   [facility]: [phone]  Start omeprazole for possible heartburn causing chest symptoms.  I will check other blood work, but have also referred you to both gastroenterology and cardiology to discuss the symptoms further.  Bloating can be related to constipation at times, so trying a stool softener such as Colace and making sure you have plenty of fiber in the diet or fiber supplement with Citrucel may also help.  If you do feel like you are more constipated, can try MiraLAX over-the-counter.  If any acute worsening of chest pain or worsening of your symptoms we discussed today, call 911 or be seen in the emergency room.  Recheck in the next 3 weeks and we can review some of the symptoms further as well as review notes from specialists.    Nonspecific Chest Pain, Adult Chest pain can be caused by many different conditions. It can be caused by a condition that is life-threatening and requires treatment right away. It can also be caused by something that is not life-threatening. If you have chest pain, it can be hard to know the difference, so it is important to get help right away to make sure that you do not have a serious condition. Some life-threatening causes of chest pain include:  Heart attack.  A tear in the body's main blood vessel (aortic dissection).  Inflammation around your heart (pericarditis).  A problem in the lungs, such as a blood clot (pulmonary embolism) or a collapsed lung  (pneumothorax). Some non life-threatening causes of chest pain include:  Heartburn.  Anxiety or stress.  Damage to the bones, muscles, and cartilage that make up your chest wall.  Pneumonia or bronchitis.  Shingles infection (varicella-zoster virus). Chest pain can feel like:  Pain or discomfort on the surface of your chest or deep in your chest.  Crushing, pressure, aching, or squeezing pain.  Burning or tingling.  Dull or sharp pain that is worse when you move, cough, or take a deep breath.  Pain or discomfort that is also felt in your back, neck, jaw, shoulder, or arm, or pain that spreads to any of these areas. Your chest pain may come and go. It may also be constant. Your health care provider will do lab tests and other studies to find the cause of your pain. Treatment will depend on the cause of your chest pain. Follow these instructions at home: Medicines  Take over-the-counter and prescription medicines only as told by your health care provider.  If you were prescribed an antibiotic, take it as told by your health care provider. Do not stop taking the antibiotic even if you start to feel better. Lifestyle   Rest as directed by your health care provider.  Do not use any products that contain nicotine or tobacco, such as cigarettes and e-cigarettes. If you need help quitting, ask your health care provider.  Do not drink alcohol.  Make healthy lifestyle choices as recommended. These may include: ? Getting regular exercise. Ask your  health care provider to suggest some activities that are safe for you. ? Eating a heart-healthy diet. This includes plenty of fresh fruits and vegetables, whole grains, low-fat (lean) protein, and low-fat dairy products. A dietitian can help you find healthy eating options. ? Maintaining a healthy weight. ? Managing any other health conditions you have, such as high blood pressure (hypertension) or diabetes. ? Reducing stress, such as with  yoga or relaxation techniques. General instructions  Pay attention to any changes in your symptoms. Tell your health care provider about them or any new symptoms.  Avoid any activities that cause chest pain.  Keep all follow-up visits as told by your health care provider. This is important. This includes visits for any further testing if your chest pain does not go away. Contact a health care provider if:  Your chest pain does not go away.  You feel depressed.  You have a fever. Get help right away if:  Your chest pain gets worse.  You have a cough that gets worse, or you cough up blood.  You have severe pain in your abdomen.  You faint.  You have sudden, unexplained chest discomfort.  You have sudden, unexplained discomfort in your arms, back, neck, or jaw.  You have shortness of breath at any time.  You suddenly start to sweat, or your skin gets clammy.  You feel nausea or you vomit.  You suddenly feel lightheaded or dizzy.  You have severe weakness, or unexplained weakness or fatigue.  Your heart begins to beat quickly, or it feels like it is skipping beats. These symptoms may represent a serious problem that is an emergency. Do not wait to see if the symptoms will go away. Get medical help right away. Call your local emergency services (911 in the [place] Do not drive yourself to the hospital. Summary  Chest pain can be caused by a condition that is serious and requires urgent treatment. It may also be caused by something that is not life-threatening.  If you have chest pain, it is very important to see your health care provider. Your health care provider may do lab tests and other studies to find the cause of your pain.  Follow your health care provider's instructions on taking medicines, making lifestyle changes, and getting emergency treatment if symptoms become worse.  Keep all follow-up visits as told by your health care provider. This includes visits for any  further testing if your chest pain does not go away. This information is not intended to replace advice given to you by your health care provider. Make sure you discuss any questions you have with your health care provider. Document Released: 04/23/2005 Document Revised: 01/14/2018 Document Reviewed: 01/14/2018 Elsevier Patient Education  2020 [place] is a normal reaction to life events. Stress is what you feel when life demands more than you are used to, or more than you think you can handle. Some stress can be useful, such as studying for a test or meeting a deadline at work. Stress that occurs too often or for too long can cause problems. It can affect your emotional health and interfere with relationships and normal daily activities. Too much stress can weaken your body's defense system (immune system) and increase your risk for physical illness. If you already have a medical problem, stress can make it worse. What are the causes? All sorts of life events can cause stress. An event that causes stress for one person may not be  stressful for another person. Major life events, whether positive or negative, commonly cause stress. Examples include:  Losing a job or starting a new job.  Losing a loved one.  Moving to a new town or home.  Getting married or divorced.  Having a baby.  Injury or illness. Less obvious life events can also cause stress, especially if they occur day after day or in combination with each other. Examples include:  Working long hours.  Driving in traffic.  Caring for children.  Being in debt.  Being in a difficult relationship. What are the signs or symptoms? Stress can cause emotional symptoms, including:  Anxiety. This is feeling worried, afraid, on edge, overwhelmed, or out of control.  Anger, including irritation or impatience.  Depression. This is feeling sad, down, helpless, or guilty.  Trouble focusing, remembering, or  making decisions. Stress can cause physical symptoms, including:  Aches and pains. These may affect your head, neck, back, stomach, or other areas of your body.  Tight muscles or a clenched jaw.  Low energy.  Trouble sleeping. Stress can cause unhealthy behaviors, including:  Eating to feel better (overeating) or skipping meals.  Working too much or putting off tasks.  Smoking, drinking alcohol, or using drugs to feel better. How is this diagnosed? Stress is diagnosed through an assessment by your health care provider. He or she may diagnose this condition based on:  Your symptoms and any stressful life events.  Your medical history.  Tests to rule out other causes of your symptoms. Depending on your condition, your health care provider may refer you to a specialist for further evaluation. How is this treated?  Stress management techniques are the recommended treatment for stress. Medicine is not typically recommended for the treatment of stress. Techniques to reduce your reaction to stressful life events include:  Stress identification. Monitor yourself for symptoms of stress and identify what causes stress for you. These skills may help you to avoid or prepare for stressful events.  Time management. Set your priorities, keep a calendar of events, and learn to say "no." Taking these actions can help you avoid making too many commitments. Techniques for coping with stress include:  Rethinking the problem. Try to think realistically about stressful events rather than ignoring them or overreacting. Try to find the positives in a stressful situation rather than focusing on the negatives.  Exercise. Physical exercise can release both physical and emotional tension. The key is to find a form of exercise that you enjoy and do it regularly.  Relaxation techniques. These relax the body and mind. The key is to find one or more that you enjoy and use the technique(s) regularly. Examples  include: ? Meditation, deep breathing, or progressive relaxation techniques. ? Yoga or tai chi. ? Biofeedback, mindfulness techniques, or journaling. ? Listening to music, being out in nature, or participating in other hobbies.  Practicing a healthy lifestyle. Eat a balanced diet, drink plenty of water, limit or avoid caffeine, and get plenty of sleep.  Having a strong support network. Spend time with family, friends, or other people you enjoy being around. Express your feelings and talk things over with someone you trust. Counseling or talk therapy with a mental health professional may be helpful if you are having trouble managing stress on your own. Follow these instructions at home: Lifestyle   Avoid drugs.  Do not use any products that contain nicotine or tobacco, such as cigarettes and e-cigarettes. If you need help quitting, ask your health  care provider.  Limit alcohol intake to no more than 1 drink a day for nonpregnant women and 2 drinks a day for men. One drink equals 12 oz of beer, 5 oz of wine, or 1 oz of hard liquor.  Do not use alcohol or drugs to relax.  Eat a balanced diet that includes fresh fruits and vegetables, whole grains, lean meats, fish, eggs, and beans, and low-fat dairy. Avoid processed foods and foods high in added fat, sugar, and salt.  Exercise at least 30 minutes on 5 or more days each week.  Get 7-8 hours of sleep each night. General instructions   Practice stress management techniques as discussed with your health care provider.  Drink enough fluid to keep your urine clear or pale yellow.  Take over-the-counter and prescription medicines only as told by your health care provider.  Keep all follow-up visits as told by your health care provider. This is important. Contact a health care provider if:  Your symptoms get worse.  You have new symptoms.  You feel overwhelmed by your problems and can no longer manage them on your own. Get help  right away if:  You have thoughts of hurting yourself or others. If you ever feel like you may hurt yourself or others, or have thoughts about taking your own life, get help right away. You can go to your nearest emergency department or call:  Your local emergency services (911 in the [place]  A suicide crisis helpline, such as the [facility] at (707)010-8312. This is open 24 hours a day. Summary  Stress is a normal reaction to life events. It can cause problems if it happens too often or for too long.  Practicing stress management techniques is the best way to treat stress.  Counseling or talk therapy with a mental health professional may be helpful if you are having trouble managing stress on your own. This information is not intended to replace advice given to you by your health care provider. Make sure you discuss any questions you have with your health care provider. Document Released: 01/07/2001 Document Revised: 06/26/2017 Document Reviewed: 09/03/2016 Elsevier Patient Education  [place] [facility]  Get These Tests  Blood Pressure- Have your blood pressure checked by your healthcare provider at least once a year.  Normal blood pressure is 120/80.  Weight- Have your body mass index (BMI) calculated to screen for obesity.  BMI is a measure of body fat based on height and weight.  You can calculate your own BMI at [employer].it  Cholesterol- Have your cholesterol checked every year.  Diabetes- Have your blood sugar checked every year if you have high blood pressure, high cholesterol, a family history of diabetes or if you are overweight.  Pap Test - Have a pap test every 1 to 5 years if you have been sexually active.  If you are older than 65 and recent pap tests have been normal you may not need additional pap tests.  In addition, if you have had a hysterectomy  for benign disease additional pap tests are not  necessary.  Mammogram-Yearly mammograms are essential for early detection of breast cancer  Screening for Colon Cancer- Colonoscopy starting at age 48. Screening may begin sooner depending on your family history and other health conditions.  Follow up colonoscopy as directed by your Gastroenterologist.  Screening for Osteoporosis- Screening begins at age 57 with bone density scanning, sooner if you are at higher risk for developing  Osteoporosis.  Get these medicines  Calcium with Vitamin D- Your body requires 1200-1500 mg of Calcium a day and [phone] IU of Vitamin D a day.  You can only absorb 500 mg of Calcium at a time therefore Calcium must be taken in 2 or 3 separate doses throughout the day.  Hormones- Hormone therapy has been associated with increased risk for certain cancers and heart disease.  Talk to your healthcare provider about if you need relief from menopausal symptoms.  Aspirin- Ask your healthcare provider about taking Aspirin to prevent Heart Disease and Stroke.  Get these Immuniztions  Flu shot- Every fall  Pneumonia shot- Once after the age of 52; if you are younger ask your healthcare provider if you need a pneumonia shot.  Tetanus- Every ten years.  Zostavax- Once after the age of 69 to prevent shingles.  Take these steps  Don't smoke- Your healthcare provider can help you quit. For tips on how to quit, ask your healthcare provider or go to www.smokefree.gov or call 1-800 QUIT-NOW.  Be physically active- Exercise 5 days a week for a minimum of 30 minutes.  If you are not already physically active, start slow and gradually work up to 30 minutes of moderate physical activity.  Try walking, dancing, bike riding, swimming, etc.  Eat a healthy diet- Eat a variety of healthy foods such as fruits, vegetables, whole grains, low fat milk, low fat cheeses, yogurt, lean meats, chicken, fish, eggs, dried beans, tofu, etc.  For more information go to  www.thenutritionsource.org  Dental visit- Brush and floss teeth twice daily; visit your dentist twice a year.  Eye exam- Visit your Optometrist or Ophthalmologist yearly.  Drink alcohol in moderation- Limit alcohol intake to one drink or less a day.  Never drink and drive.  Depression- Your emotional health is as important as your physical health.  If you're feeling down or losing interest in things you normally enjoy, please talk to your healthcare provider.  Seat Belts- can save your life; always wear one  Smoke/Carbon Monoxide detectors- These detectors need to be installed on the appropriate level of your home.  Replace batteries at least once a year.  Violence- If anyone is threatening or hurting you, please tell your healthcare provider.  Living Will/ Health care power of attorney- Discuss with your healthcare provider and family.  If you have lab work done today you will be contacted with your lab results within the next 2 weeks.  If you have not heard from [place] then please contact us. The fastest way to get your results is to register for My Chart.   IF you received an x-ray today, you will receive an invoice from [facility]. Please contact [facility] at [phone] with questions or concerns regarding your invoice.   IF you received labwork today, you will receive an invoice from [facility]. Please contact [facility] at [phone] with questions or concerns regarding your invoice.   Our billing staff will not be able to assist you with questions regarding bills from these companies.  You will be contacted with the lab results as soon as they are available. The fastest way to get your results is to activate your My Chart account. Instructions are located on the last page of this paperwork. If you have not heard from [place] regarding the results in 2 weeks, please contact this office.

## 2019-03-25 NOTE — Progress Notes (Signed)
Subjective:    Patient ID: Misty Burke, female    DOB: 20-Dec-1955, 63 y.o.   MRN: [id number]  HPI Misty Burke is a 63 y.o. female Presents today for: Chief Complaint  Patient presents with   Annual Exam    cpe with pap. Pt would like female MD to perform pap but is willing to come back later if not able to do it today.  Presents for annual exam.  History of allergic rhinitis, history of peripheral edema.  Allergic rhinitis: Flonase, Allegra, Atrovent prescribed previously.  Bloating/gas: Past month. Upper abdomen at times. No fever. Nausea but no vomiting.  Hot sensation in stomach and warm sensation into esophagus, some tightness in chest at times when feeling warm feeling. Pressure feeling into throat. Feels short of breath at times of chest pressure.  Pressure sensation in upper chest. Drinks water  - helps. Used inhaler in past after death of parent that seemed to help then. Feels like stress makes heaviness worse.  No history of MI/CAD known. Possible stress test many years ago - normal.  No pain /pressure with exertion.  No diaphoresis BM daily, some hard stools at times. BRBPR when wiping a week ago, followed by dark stool no iron supplement, no bismuth.  Different shaped stools past month, and lighter or darker at times. Up to 5 BM in one day, no diarrhea.  No bleeding ulcer/PUD in past known Rare alcohol.  Asa 2 per day as needed. No nsaids.  Fatigue  At times, no syncope/near syncope.  No dysuria/frequency/hematuria.   Cancer screening; Colon: last done in [place] in approx 10-15-14 - nl, repeat 10 years.  Breast, mammogram 03/24/2018. Cervical-plan on Pap testing today.  Immunization History  Administered Date(s) Administered   Td 06/28/2013  Flu vaccine: declines today.  Shingles: declines.    Depression: History of depression. Does not like meds, tried holistic approach HTP-5? From [facility].  Last met with therapist about 2 years ago. Worked  well.  Feels worse depression at times - sporadic.  Mom passed away in October 16, 2011.  Felt depressed since that time.  Sister passed December 2018.  Neighbor with mental illness that is tormenting is another stressor/burden.  Now in activist situation, advocate for homeless. Was able to find agency  - [facility] ending homelessness, that will help with that family.  Change in her vision has also been a stressor.  No suicidal thoughts/plan.  Depression screen [facility] 2/9 03/25/2019 03/30/2018 12/24/2016  Decreased Interest 1 0 0  Down, Depressed, Hopeless 2 0 0  PHQ - 2 Score 3 0 0  Altered sleeping 3 - -  Tired, decreased energy 2 - -  Change in appetite 1 - -  Feeling bad or failure about yourself  3 - -  Trouble concentrating 2 - -  Moving slowly or fidgety/restless 1 - -  Suicidal thoughts 0 - -  PHQ-9 Score 15 - -  Difficult doing work/chores Very difficult - -     Hearing Screening   125Hz  250Hz  500Hz  1000Hz  2000Hz  3000Hz  4000Hz  6000Hz  8000Hz   Right ear:           Left ear:             Visual Acuity Screening   Right eye Left eye Both eyes  Without correction: 20/20 20/20 20/20   With correction:     Ophthalmology Dr. Gershon Crane with evaluation August 13. Has also been under the care of of retinal specialist Dr. Coralyn Pear -cystic retinal  tuft of right eye, right retinal break, posterior vitreous detachment of right eye, cataract with retinal edema.  No retinal edema on last visit May 11.  Monitored cataracts at this time as not visually significant.\`  Dental: No recent eval.   Exercise: No regular exercise.   Alcohol: once per month.  No tobacco.  No IDU.   approx 56mn of face to face care for initial history.   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 Authorizing Provider  fexofenadine (ALLEGRA) 180 MG tablet Take 180 mg by mouth daily.   Yes [provider]  fluticasone (FLONASE) 50 MCG/ACT nasal spray Place 1-2 sprays into both nostrils at bedtime. 03/30/18  Yes GWendie Agreste MD  ipratropium (ATROVENT) 0.06 % nasal spray Place 1-2 sprays into both nostrils 4 (four) times daily as needed for rhinitis. Patient not taking: Reported on 03/25/2019 03/30/18   GWendie Agreste MD   Social History   Socioeconomic History   Marital status: Single    Spouse name: Not on file   Number of children: Not on file   Years of education: Not on file   Highest education level: Not on file  Occupational History   Not on file  Social Needs   Financial resource strain: Not on file   Food insecurity    Worry: Not on file    Inability: Not on file   Transportation needs    Medical: Not on file    Non-medical: Not on file  Tobacco Use   Smoking status: Never Smoker   Smokeless tobacco: Never Used  Substance and Sexual Activity   Alcohol use: Yes   Drug use: No   Sexual activity: Not Currently  Lifestyle   Physical activity    Days per week: Not on file    Minutes per session: Not on file   Stress: Not on file  Relationships   Social connections    Talks on phone: Not on file    Gets together: Not on file    Attends religious service: Not on file    Active member of club or organization: Not on file    Attends meetings of clubs or organizations: Not on file    Relationship status: Not on file   Intimate partner violence    Fear of current or ex partner: Not on file    Emotionally abused: Not on file    Physically abused: Not on file    Forced sexual activity: Not on file  Other Topics Concern   Not on file  Social  History Narrative   Not on file    Review of Systems 13 point review of systems per patient health survey noted.  Negative other than as indicated above or in HPI. No positive responses on ROS form at check in.      Objective:   Physical Exam Exam conducted with a chaperone present (for abdominal exam, pelvic exam completed by Dr. SPamella Pert ).  Constitutional:      Appearance: She is  well-developed.  HENT:     Head: Normocephalic and atraumatic.     Right Ear: External ear normal.     Left Ear: External ear normal.  Eyes:     Conjunctiva/sclera: Conjunctivae normal.     Pupils: Pupils are equal, round, and reactive to light.  Neck:     Musculoskeletal: Normal range of motion and neck supple.     Thyroid: No thyromegaly.  Cardiovascular:     Rate and Rhythm: Normal rate and regular rhythm.     Heart sounds: Normal heart sounds. No murmur.  Pulmonary:     Effort: Pulmonary effort is normal. No respiratory distress.     Breath sounds: Normal breath sounds. No wheezing.  Abdominal:     General: Bowel sounds are normal. There is no distension.     Palpations: Abdomen is soft.     Tenderness: There is no abdominal tenderness (pressure feeling lower abdomen, no focal ttp. ).  Musculoskeletal: Normal range of motion.        General: No tenderness.     Right lower leg: No edema.     Left lower leg: No edema.  Lymphadenopathy:     Cervical: No cervical adenopathy.  Skin:    General: Skin is warm and dry.     Findings: No rash.  Neurological:     Mental Status: She is alert and oriented to person, place, and time.  Psychiatric:        Mood and Affect: Mood is anxious.        Speech: Speech normal.        Behavior: Behavior normal.        Thought Content: Thought content normal. Thought content does not include homicidal or suicidal ideation.    Vitals:   03/25/19 1506  BP: 112/76  Pulse: 71  Resp: 17  Temp: 98.7 F (37.1 C)  TempSrc: Oral  SpO2: 100%  Weight: 242 lb  9.6 oz (110 kg)  Height: 5' 9"  (1.753 m)   EKG, sinus rhythm, rate 64, no apparent acute ST/T wave changes or other acute findings.  Results for orders placed or performed in visit on 03/25/19  POCT CBC  Result Value Ref Range   WBC 10.3 (A) 4.6 - 10.2 K/uL   Lymph, poc 2.4 0.6 - 3.4   POC LYMPH PERCENT 23.2 10 - 50 %L   MID (cbc) 0.8 0 - 0.9   POC MID % 7.8 0 - 12 %M   POC Granulocyte 7.1 (A) 2 - 6.9   Granulocyte percent 69.0 37 - 80 %G   RBC 4.93 4.04 - 5.48 M/uL   Hemoglobin 12.7 11 - 14.6 g/dL   HCT, POC 41.9 (A) 29 - 41 %   MCV 85.0 76 - 111 fL   MCH, POC 27.7 27 - 31.2 pg   MCHC 32.6 31.8 - 35.4 g/dL   RDW, POC 15.4 %   Platelet Count, POC 209 142 - 424 K/uL   MPV 11.9 0 - 99.8 fL  POCT urinalysis dipstick  Result Value Ref Range   Color, UA yellow yellow   Clarity, UA clear clear   Glucose, UA negative negative mg/dL   Bilirubin, UA negative negative   Ketones, POC UA negative negative mg/dL   Spec Grav, UA >=1.030 (A) 1.010 - 1.025   Blood, UA negative negative   pH, UA 5.5 5.0 - 8.0   Protein Ur, POC negative negative mg/dL   Urobilinogen, UA 0.2 0.2 or 1.0 E.U./dL  Nitrite, UA Negative Negative   Leukocytes, UA Negative Negative    Dg Chest 2 View  Result Date: 03/25/2019 CLINICAL DATA:  Intermittent chest tightness for the past 3 weeks. EXAM: CHEST - 2 VIEW COMPARISON:  Chest x-ray dated Dec 24, 2016. FINDINGS: The heart size and mediastinal contours are within normal limits. Both lungs are clear. Unchanged eventration of the right hemidiaphragm. The visualized skeletal structures are unremarkable. IMPRESSION: No active cardiopulmonary disease. Electronically Signed   By: Titus Dubin M.D.   On: 03/25/2019 16:54       Assessment & Plan:    Misty Burke is a 63 y.o. female Routine physical examination  - -anticipatory guidance as below in AVS, screening labs above. Health maintenance items as above in HPI discussed/recommended as applicable.    Screening for cervical cancer - Plan: Cytology - PAP([place]), CANCELED: Cytology - PAP([place])  Screening for STDs (sexually transmitted diseases) - Plan: HIV antibody (with reflex)  Need for prophylactic vaccination and inoculation against influenza  Encounter for hepatitis C screening test for low risk patient - Plan: Hepatitis C antibody  Chest pressure - Plan: DG Chest 2 View, POCT CBC, Ambulatory referral to Cardiology, EKG 12-Lead, DISCONTINUED: omeprazole (PRILOSEC) 20 MG capsule  -Possible reflux recommend not resolved, new concerns on EKG/chest x-ray.  - refer to cardiology.   Abdominal bloating - Plan: POCT CBC, Ambulatory referral to Gastroenterology, POCT urinalysis dipstick Change in stool - Plan: Ambulatory referral to Gastroenterology BRBPR (bright red blood per rectum) - Plan: POCT CBC, Ambulatory referral to Gastroenterology  -Constipation prevention discussed for possible link with bullying.  Colace as needed.  Citrucel discussed for fiber supplement.  Refer to gastroenterology.   Depression, unspecified depression type Situational stress  -We will consider SSRI but declined at present.  Stress management discussed recommended counseling with numbers provided..   Screening for HIV (human immunodeficiency virus)  Nausea without vomiting - Plan: Ambulatory referral to Gastroenterology, Lipase, POCT urinalysis dipstick  -Checked lipise.  Eval with gastroenterology as above.  Screening for hyperlipidemia - Plan: Comprehensive metabolic panel, Lipid panel   Meds ordered this encounter  Medications   DISCONTD: omeprazole (PRILOSEC) 20 MG capsule    Sig: Take 1 capsule (20 mg total) by mouth daily.    Dispense:  30 capsule    Refill:  1   Patient Instructions    I am concerned that your depression is worse, and stressors are affecting you more. I do recommend call your previous therapist to follow up.  Here is another number if needed.  See information  on stress below. I usually would recommend medication as well, but let me know if you change your mind about medication. Follow up in next 3 weeks to discuss these symptoms further Return to the clinic or go to the nearest emergency room if any of your symptoms worsen or new symptoms occur.   [facility]: [phone]  Start omeprazole for possible heartburn causing chest symptoms.  I will check other blood work, but have also referred you to both gastroenterology and cardiology to discuss the symptoms further.  Bloating can be related to constipation at times, so trying a stool softener such as Colace and making sure you have plenty of fiber in the diet or fiber supplement with Citrucel may also help.  If you do feel like you are more constipated, can try MiraLAX over-the-counter.  If any acute worsening of chest pain or worsening of your symptoms we discussed today, call  911 or be seen in the emergency room.  Recheck in the next 3 weeks and we can review some of the symptoms further as well as review notes from specialists.    Nonspecific Chest Pain, Adult Chest pain can be caused by many different conditions. It can be caused by a condition that is life-threatening and requires treatment right away. It can also be caused by something that is not life-threatening. If you have chest pain, it can be hard to know the difference, so it is important to get help right away to make sure that you do not have a serious condition. Some life-threatening causes of chest pain include:  Heart attack.  A tear in the body's main blood vessel (aortic dissection).  Inflammation around your heart (pericarditis).  A problem in the lungs, such as a blood clot (pulmonary embolism) or a collapsed lung (pneumothorax). Some non life-threatening causes of chest pain include:  Heartburn.  Anxiety or stress.  Damage to the bones, muscles, and cartilage that make up your chest wall.  Pneumonia  or bronchitis.  Shingles infection (varicella-zoster virus). Chest pain can feel like:  Pain or discomfort on the surface of your chest or deep in your chest.  Crushing, pressure, aching, or squeezing pain.  Burning or tingling.  Dull or sharp pain that is worse when you move, cough, or take a deep breath.  Pain or discomfort that is also felt in your back, neck, jaw, shoulder, or arm, or pain that spreads to any of these areas. Your chest pain may come and go. It may also be constant. Your health care provider will do lab tests and other studies to find the cause of your pain. Treatment will depend on the cause of your chest pain. Follow these instructions at home: Medicines  Take over-the-counter and prescription medicines only as told by your health care provider.  If you were prescribed an antibiotic, take it as told by your health care provider. Do not stop taking the antibiotic even if you start to feel better. Lifestyle   Rest as directed by your health care provider.  Do not use any products that contain nicotine or tobacco, such as cigarettes and e-cigarettes. If you need help quitting, ask your health care provider.  Do not drink alcohol.  Make healthy lifestyle choices as recommended. These may include: ? Getting regular exercise. Ask your health care provider to suggest some activities that are safe for you. ? Eating a heart-healthy diet. This includes plenty of fresh fruits and vegetables, whole grains, low-fat (lean) protein, and low-fat dairy products. A dietitian can help you find healthy eating options. ? Maintaining a healthy weight. ? Managing any other health conditions you have, such as high blood pressure (hypertension) or diabetes. ? Reducing stress, such as with yoga or relaxation techniques. General instructions  Pay attention to any changes in your symptoms. Tell your health care provider about them or any new symptoms.  Avoid any activities that  cause chest pain.  Keep all follow-up visits as told by your health care provider. This is important. This includes visits for any further testing if your chest pain does not go away. Contact a health care provider if:  Your chest pain does not go away.  You feel depressed.  You have a fever. Get help right away if:  Your chest pain gets worse.  You have a cough that gets worse, or you cough up blood.  You have severe pain in your abdomen.  You faint.  You have sudden, unexplained chest discomfort.  You have sudden, unexplained discomfort in your arms, back, neck, or jaw.  You have shortness of breath at any time.  You suddenly start to sweat, or your skin gets clammy.  You feel nausea or you vomit.  You suddenly feel lightheaded or dizzy.  You have severe weakness, or unexplained weakness or fatigue.  Your heart begins to beat quickly, or it feels like it is skipping beats. These symptoms may represent a serious problem that is an emergency. Do not wait to see if the symptoms will go away. Get medical help right away. Call your local emergency services (911 in the [place] Do not drive yourself to the hospital. Summary  Chest pain can be caused by a condition that is serious and requires urgent treatment. It may also be caused by something that is not life-threatening.  If you have chest pain, it is very important to see your health care provider. Your health care provider may do lab tests and other studies to find the cause of your pain.  Follow your health care provider's instructions on taking medicines, making lifestyle changes, and getting emergency treatment if symptoms become worse.  Keep all follow-up visits as told by your health care provider. This includes visits for any further testing if your chest pain does not go away. This information is not intended to replace advice given to you by your health care provider. Make sure you discuss any questions you have  with your health care provider. Document Released: 04/23/2005 Document Revised: 01/14/2018 Document Reviewed: 01/14/2018 Elsevier Patient Education  [employer] [place] is a normal reaction to life events. Stress is what you feel when life demands more than you are used to, or more than you think you can handle. Some stress can be useful, such as studying for a test or meeting a deadline at work. Stress that occurs too often or for too long can cause problems. It can affect your emotional health and interfere with relationships and normal daily activities. Too much stress can weaken your body's defense system (immune system) and increase your risk for physical illness. If you already have a medical problem, stress can make it worse. What are the causes? All sorts of life events can cause stress. An event that causes stress for one person may not be stressful for another person. Major life events, whether positive or negative, commonly cause stress. Examples include:  Losing a job or starting a new job.  Losing a loved one.  Moving to a new town or home.  Getting married or divorced.  Having a baby.  Injury or illness. Less obvious life events can also cause stress, especially if they occur day after day or in combination with each other. Examples include:  Working long hours.  Driving in traffic.  Caring for children.  Being in debt.  Being in a difficult relationship. What are the signs or symptoms? Stress can cause emotional symptoms, including:  Anxiety. This is feeling worried, afraid, on edge, overwhelmed, or out of control.  Anger, including irritation or impatience.  Depression. This is feeling sad, down, helpless, or guilty.  Trouble focusing, remembering, or making decisions. Stress can cause physical symptoms, including:  Aches and pains. These may affect your head, neck, back, stomach, or other areas of your body.  Tight muscles or a clenched  jaw.  Low energy.  Trouble sleeping. Stress can cause unhealthy behaviors, including:  Eating  to feel better (overeating) or skipping meals.  Working too much or putting off tasks.  Smoking, drinking alcohol, or using drugs to feel better. How is this diagnosed? Stress is diagnosed through an assessment by your health care provider. He or she may diagnose this condition based on:  Your symptoms and any stressful life events.  Your medical history.  Tests to rule out other causes of your symptoms. Depending on your condition, your health care provider may refer you to a specialist for further evaluation. How is this treated?  Stress management techniques are the recommended treatment for stress. Medicine is not typically recommended for the treatment of stress. Techniques to reduce your reaction to stressful life events include:  Stress identification. Monitor yourself for symptoms of stress and identify what causes stress for you. These skills may help you to avoid or prepare for stressful events.  Time management. Set your priorities, keep a calendar of events, and learn to say no. Taking these actions can help you avoid making too many commitments. Techniques for coping with stress include:  Rethinking the problem. Try to think realistically about stressful events rather than ignoring them or overreacting. Try to find the positives in a stressful situation rather than focusing on the negatives.  Exercise. Physical exercise can release both physical and emotional tension. The key is to find a form of exercise that you enjoy and do it regularly.  Relaxation techniques. These relax the body and mind. The key is to find one or more that you enjoy and use the technique(s) regularly. Examples include: ? Meditation, deep breathing, or progressive relaxation techniques. ? Yoga or tai chi. ? Biofeedback, mindfulness techniques, or journaling. ? Listening to music, being out in  nature, or participating in other hobbies.  Practicing a healthy lifestyle. Eat a balanced diet, drink plenty of water, limit or avoid caffeine, and get plenty of sleep.  Having a strong support network. Spend time with family, friends, or other people you enjoy being around. Express your feelings and talk things over with someone you trust. Counseling or talk therapy with a mental health professional may be helpful if you are having trouble managing stress on your own. Follow these instructions at home: Lifestyle   Avoid drugs.  Do not use any products that contain nicotine or tobacco, such as cigarettes and e-cigarettes. If you need help quitting, ask your health care provider.  Limit alcohol intake to no more than 1 drink a day for nonpregnant women and 2 drinks a day for men. One drink equals 12 oz of beer, 5 oz of wine, or 1 oz of hard liquor.  Do not use alcohol or drugs to relax.  Eat a balanced diet that includes fresh fruits and vegetables, whole grains, lean meats, fish, eggs, and beans, and low-fat dairy. Avoid processed foods and foods high in added fat, sugar, and salt.  Exercise at least 30 minutes on 5 or more days each week.  Get 7-8 hours of sleep each night. General instructions   Practice stress management techniques as discussed with your health care provider.  Drink enough fluid to keep your urine clear or pale yellow.  Take over-the-counter and prescription medicines only as told by your health care provider.  Keep all follow-up visits as told by your health care provider. This is important. Contact a health care provider if:  Your symptoms get worse.  You have new symptoms.  You feel overwhelmed by your problems and can no longer manage them on  your own. Get help right away if:  You have thoughts of hurting yourself or others. If you ever feel like you may hurt yourself or others, or have thoughts about taking your own life, get help right away. You  can go to your nearest emergency department or call:  Your local emergency services (911 in the [place]  A suicide crisis helpline, such as the [facility] at 813-838-7687. This is open 24 hours a day. Summary  Stress is a normal reaction to life events. It can cause problems if it happens too often or for too long.  Practicing stress management techniques is the best way to treat stress.  Counseling or talk therapy with a mental health professional may be helpful if you are having trouble managing stress on your own. This information is not intended to replace advice given to you by your health care provider. Make sure you discuss any questions you have with your health care provider. Document Released: 01/07/2001 Document Revised: 06/26/2017 Document Reviewed: 09/03/2016 Elsevier Patient Education  [place] [facility]  Get These Tests  Blood Pressure- Have your blood pressure checked by your healthcare provider at least once a year.  Normal blood pressure is 120/80.  Weight- Have your body mass index (BMI) calculated to screen for obesity.  BMI is a measure of body fat based on height and weight.  You can calculate your own BMI at [email or web address]  Cholesterol- Have your cholesterol checked every year.  Diabetes- Have your blood sugar checked every year if you have high blood pressure, high cholesterol, a family history of diabetes or if you are overweight.  Pap Test - Have a pap test every 1 to 5 years if you have been sexually active.  If you are older than 65 and recent pap tests have been normal you may not need additional pap tests.  In addition, if you have had a hysterectomy  for benign disease additional pap tests are not necessary.  Mammogram-Yearly mammograms are essential for early detection of breast cancer  Screening for Colon Cancer- Colonoscopy starting at age 40. Screening may begin sooner depending on your  family history and other health conditions.  Follow up colonoscopy as directed by your Gastroenterologist.  Screening for Osteoporosis- Screening begins at age 75 with bone density scanning, sooner if you are at higher risk for developing Osteoporosis.  Get these medicines  Calcium with Vitamin D- Your body requires 1200-1500 mg of Calcium a day and [phone] IU of Vitamin D a day.  You can only absorb 500 mg of Calcium at a time therefore Calcium must be taken in 2 or 3 separate doses throughout the day.  Hormones- Hormone therapy has been associated with increased risk for certain cancers and heart disease.  Talk to your healthcare provider about if you need relief from menopausal symptoms.  Aspirin- Ask your healthcare provider about taking Aspirin to prevent Heart Disease and Stroke.  Get these Immuniztions  Flu shot- Every fall  Pneumonia shot- Once after the age of 52; if you are younger ask your healthcare provider if you need a pneumonia shot.  Tetanus- Every ten years.  Zostavax- Once after the age of 58 to prevent shingles.  Take these steps  Don't smoke- Your healthcare provider can help you quit. For tips on how to quit, ask your healthcare provider or go to www.smokefree.gov or call 1-800 QUIT-NOW.  Be physically active- Exercise 5 days a week for a minimum of  30 minutes.  If you are not already physically active, start slow and gradually work up to 30 minutes of moderate physical activity.  Try walking, dancing, bike riding, swimming, etc.  Eat a healthy diet- Eat a variety of healthy foods such as fruits, vegetables, whole grains, low fat milk, low fat cheeses, yogurt, lean meats, chicken, fish, eggs, dried beans, tofu, etc.  For more information go to www.thenutritionsource.org  Dental visit- Brush and floss teeth twice daily; visit your dentist twice a year.  Eye exam- Visit your Optometrist or Ophthalmologist yearly.  Drink alcohol in moderation- Limit alcohol  intake to one drink or less a day.  Never drink and drive.  Depression- Your emotional health is as important as your physical health.  If you're feeling down or losing interest in things you normally enjoy, please talk to your healthcare provider.  Seat Belts- can save your life; always wear one  Smoke/Carbon Monoxide detectors- These detectors need to be installed on the appropriate level of your home.  Replace batteries at least once a year.  Violence- If anyone is threatening or hurting you, please tell your healthcare provider.  Living Will/ Health care power of attorney- Discuss with your healthcare provider and family.  If you have lab work done today you will be contacted with your lab results within the next 2 weeks.  If you have not heard from [place] then please contact us. The fastest way to get your results is to register for My Chart.   IF you received an x-ray today, you will receive an invoice from [facility]. Please contact [facility] at [phone] with questions or concerns regarding your invoice.   IF you received labwork today, you will receive an invoice from [place]. Please contact [facility] at [phone] with questions or concerns regarding your invoice.   Our billing staff will not be able to assist you with questions regarding bills from these companies.  You will be contacted with the lab results as soon as they are available. The fastest way to get your results is to activate your My Chart account. Instructions are located on the last page of this paperwork. If you have not heard from [place] regarding the results in 2 weeks, please contact this office.      Signed,   Merri Ray, MD [facility] [place].  04/14/19 3:31 PM

## 2019-03-26 LAB — LIPID PANEL
Chol/HDL Ratio: 3.8 ratio (ref 0.0–4.4)
Cholesterol, Total: 189 mg/dL (ref 100–199)
HDL: 50 mg/dL (ref >39)
LDL Calculated: 114 mg/dL — ABNORMAL HIGH (ref 0–99)
Triglycerides: 123 mg/dL (ref 0–149)
VLDL Cholesterol Cal: 25 mg/dL (ref 5–40)

## 2019-03-26 LAB — COMPREHENSIVE METABOLIC PANEL
ALT: 17 IU/L (ref 0–32)
AST: 21 IU/L (ref 0–40)
Albumin/Globulin Ratio: 1.7 (ref 1.2–2.2)
Albumin: 4.5 g/dL (ref 3.8–4.8)
Alkaline Phosphatase: 110 IU/L (ref 39–117)
BUN/Creatinine Ratio: 12 (ref 12–28)
BUN: 13 mg/dL (ref 8–27)
Bilirubin Total: 0.4 mg/dL (ref 0.0–1.2)
CO2: 23 mmol/L (ref 20–29)
Calcium: 9.6 mg/dL (ref 8.7–10.3)
Chloride: 106 mmol/L (ref 96–106)
Creatinine, Ser: 1.11 mg/dL — ABNORMAL HIGH (ref 0.57–1.00)
GFR calc Af Amer: 62 mL/min/{1.73_m2} (ref >59)
GFR calc non Af Amer: 54 mL/min/{1.73_m2} — ABNORMAL LOW (ref >59)
Globulin, Total: 2.7 g/dL (ref 1.5–4.5)
Glucose: 81 mg/dL (ref 65–99)
Potassium: 4.9 mmol/L (ref 3.5–5.2)
Sodium: 143 mmol/L (ref 134–144)
Total Protein: 7.2 g/dL (ref 6.0–8.5)

## 2019-03-26 LAB — LIPASE: Lipase: 50 U/L (ref 14–72)

## 2019-03-26 LAB — HEPATITIS C ANTIBODY: Hep C Virus Ab: 0.1 s/co ratio (ref 0.0–0.9)

## 2019-03-26 LAB — HIV ANTIBODY (ROUTINE TESTING W REFLEX): HIV Screen 4th Generation wRfx: NONREACTIVE

## 2019-03-28 ENCOUNTER — Encounter: Payer: Self-pay | Admitting: Gastroenterology

## 2019-03-30 ENCOUNTER — Other Ambulatory Visit: Payer: Self-pay

## 2019-03-30 ENCOUNTER — Ambulatory Visit (INDEPENDENT_AMBULATORY_CARE_PROVIDER_SITE_OTHER): Payer: Medicare Other | Admitting: Cardiology

## 2019-03-30 ENCOUNTER — Telehealth: Payer: Self-pay

## 2019-03-30 VITALS — BP 130/70 | HR 70 | Temp 97.3°F | Ht 70.0 in | Wt 241.0 lb

## 2019-03-30 DIAGNOSIS — R079 Chest pain, unspecified: Secondary | ICD-10-CM

## 2019-03-30 DIAGNOSIS — Z7189 Other specified counseling: Secondary | ICD-10-CM

## 2019-03-30 LAB — CYTOLOGY - PAP
Adequacy: ABSENT
Diagnosis: NEGATIVE

## 2019-03-30 NOTE — Telephone Encounter (Signed)
Called pt and scheduled COVID test for 04/09/19 at 12:20 pm.

## 2019-03-30 NOTE — Patient Instructions (Addendum)
Medication Instructions:  Your Physician recommend you continue on your current medication as directed.    If you need a refill on your cardiac medications before your next appointment, please call your pharmacy.   Lab work: None  Testing/Procedures: Your physician has requested that you have an exercise tolerance test. For further information please visit [email or web address]. Please also follow instruction sheet, as given.  COVID Test [address] [place], [place] [address]   Follow-Up: At [facility], you and your health needs are our priority.  As part of our continuing mission to provide you with exceptional heart care, we have created designated Provider Care Teams.  These Care Teams include your primary Cardiologist (physician) and Advanced Practice Providers (APPs -  Physician Assistants and Nurse Practitioners) who all work together to provide you with the care you need, when you need it. You will need a follow up appointment as needed.  Please call our office 2 months in advance to schedule this appointment.  You may see Dr. Christopher or one of the following Advanced Practice Providers on your designated Care Team:   Rhonda Barrett, PA-C . Kathryn Lawrence, DNP, ANP    [facility] at [address], [address] [place], [place] [address] Phone:  [phone]        You are scheduled for an Exercise Stress Test  Please arrive 15 minutes prior to your appointment time for registration and insurance purposes.  The test will take approximately 45 minutes to complete.  How to prepare for your Exercise Stress Test: . Do bring a list of your current medications with you.  If not listed below, you may take your medications as normal. . Do wear comfortable clothes (no dresses or overalls) and walking shoes, tennis shoes preferred (no heels or open toed shoes are allowed) . Do Not wear cologne, perfume, aftershave or lotions  (deodorant is allowed). . Please report to [address], [address] for your test.  If these instructions are not followed, your test will have to be rescheduled.  If you have questions or concerns about your appointment, you can call the Stress Lab at [phone].  If you cannot keep your appointment, please provide 24 hours notification to the Stress Lab, to avoid a possible $50 charge to your account   

## 2019-03-30 NOTE — Progress Notes (Signed)
Cardiology Office Note:    Date:  03/30/2019   ID:  Misty StainsRita M Akhavan, DOB 09/11/1955, MRN [id number]  PCP:  Shade FloodGreene, Jeffrey R, MD  Cardiologist:  Jodelle RedBridgette Jeannemarie Sawaya, MD PhD  Referring MD: Shade FloodGreene, Jeffrey R, MD   CC: new patient evaluation for chest tightness  History of Present Illness:    Misty Burke is a 63 y.o. female with a hx of allergic rhinitis, chest tightness who is seen as a new consult at the request of Shade FloodGreene, Jeffrey R, MD for the evaluation and management of chest pressure.  Patient concerns: catch-22 with needing iron with gastritis but having abnormal bowel movements. Belches all day, bloating, heaviness in her chest. She doesn't think the chest heaviness is her heart but wants to make sure that nothing serious is being missed.  Chest pain: -Initial onset: end of July, when she started having a lot of stress -Quality:heaviness, usually when lying down but sometimes during the day, sitting still. Feels like someone is pulling the chest on both sides. Can't take a deep breath. Non exertional.  -Frequency: every day -Duration: can last as long as all night or until she falls asleep -Associated symptoms: nausea, shortness of breath. Once coughed up thick white phlegm. -Aggravating/alleviating factors: improves with drinking water, taking aspirin/BC powder, putting rubbing alcohol on her face -Prior cardiac history: none -Prior ECG: NSR at Dr. Paralee CancelGreene's office -Prior workup: none -Prior treatment: none -Alcohol: occasional, 1-2x/month -Tobacco: never -Comorbidities: BMI 35 -Exercise level: no intentional exercise. Has been limited by depression she feels. Grocery shopping is fine, can climb stairs. -Cardiac ROS: no shortness of breath, no PND, no orthopnea, no LE edema, no syncope -Family history: none that she is aware of re: cardiovascular disease  We spent significant time today reviewing different parts of the cardiovascular system (electrical, vascular, functional,  and valvular). We discussed how each of these systems can present with different symptoms. We reviewed that there are different ways we evaluate these symptoms with tests. We reviewed which tests I think are most appropriate given the symptoms, and we discussed risks/benefits and limitations of each of these tests. Please see summary below. We also discussed that if testing is unrevealing for a cardiac cause of the symptoms, there are many noncardiac causes as well that can contribute to symptoms. If the heart is ruled out, then I recommend returning to PCP to discuss alternative diagnoses.  Past Medical History:  Diagnosis Date   Angio-edema    Depression    Urticaria     Past Surgical History:  Procedure Laterality Date   BREAST CYST ASPIRATION Left    NO PAST SURGERIES      Current Medications: Current Outpatient Medications on File Prior to Visit  Medication Sig   fexofenadine (ALLEGRA) 180 MG tablet Take 180 mg by mouth daily.   fluticasone (FLONASE) 50 MCG/ACT nasal spray Place 1-2 sprays into both nostrils at bedtime.   ipratropium (ATROVENT) 0.06 % nasal spray Place 1-2 sprays into both nostrils 4 (four) times daily as needed for rhinitis.   omeprazole (PRILOSEC) 20 MG capsule TAKE 1 CAPSULE(20 MG) BY MOUTH DAILY   No current facility-administered medications on file prior to visit.      Allergies:   Patient has no known allergies.   Social History   Socioeconomic History   Marital status: Single    Spouse name: Not on file   Number of children: Not on file   Years of education: Not on file   Highest  education level: Not on file  Occupational History   Not on file  Social Needs   Financial resource strain: Not on file   Food insecurity    Worry: Not on file    Inability: Not on file   Transportation needs    Medical: Not on file    Non-medical: Not on file  Tobacco Use   Smoking status: Never Smoker   Smokeless tobacco: Never Used  Substance  and Sexual Activity   Alcohol use: Yes   Drug use: No   Sexual activity: Not Currently  Lifestyle   Physical activity    Days per week: Not on file    Minutes per session: Not on file   Stress: Not on file  Relationships   Social connections    Talks on phone: Not on file    Gets together: Not on file    Attends religious service: Not on file    Active member of club or organization: Not on file    Attends meetings of clubs or organizations: Not on file    Relationship status: Not on file  Other Topics Concern   Not on file  Social History Narrative   Not on file     Family History: The patient's family history includes Cancer in her father and another family member. There is no history of Diabetes, Hypertension, or Breast cancer. No known cardiovascular disease history.  ROS:   Please see the history of present illness.  Additional pertinent ROS: Constitutional: Negative for chills, fever, night sweats, unintentional weight loss. Has gained 60 lbs in 4 years.  HENT: Negative for ear pain and hearing loss.   Eyes: Positive for floaters, seeing optho Respiratory: Negative for cough, sputum, wheezing except for one time Cardiovascular: See HPI. Gastrointestinal: Positive for GI pain, slight blood on tissue (not sure if vaginal or anal) Genitourinary: Negative for dysuria and hematuria.  Musculoskeletal: Negative for falls and myalgias.  Skin: Negative for itching and rash.  Neurological: Negative for focal weakness, focal sensory changes and loss of consciousness.  Endo/Heme/Allergies: Does bruise/bleed easily.     EKGs/Labs/Other Studies Reviewed:    The following studies were reviewed today: Notes from Dr. Neva Seat  EKG:  EKG is personally reviewed.  The ekg ordered today demonstrates NSR  Recent Labs: 03/25/2019: ALT 17; BUN 13; Creatinine, Ser 1.11; Hemoglobin 12.7; Potassium 4.9; Sodium 143  Recent Lipid Panel    Component Value Date/Time   CHOL 189  03/25/2019 1654   TRIG 123 03/25/2019 1654   HDL 50 03/25/2019 1654   CHOLHDL 3.8 03/25/2019 1654   LDLCALC 114 (H) 03/25/2019 1654    Physical Exam:    VS:  BP 130/70    Pulse 70    Temp (!) 97.3 F (36.3 C) (Temporal)    Ht 5\' 10"  (1.778 m)    Wt 241 lb (109.3 kg)    SpO2 99%    BMI 34.58 kg/m     Wt Readings from Last 3 Encounters:  03/30/19 241 lb (109.3 kg)  03/25/19 242 lb 9.6 oz (110 kg)  04/15/18 229 lb 6.4 oz (104.1 kg)     GEN: Well nourished, well developed in no acute distress HEENT: Normal, moist mucous membranes NECK: No JVD CARDIAC: regular rhythm, normal S1 and S2, no murmurs, rubs, gallops.  VASCULAR: Radial and DP pulses 2+ bilaterally. No carotid bruits RESPIRATORY:  Clear to auscultation without rales, wheezing or rhonchi  ABDOMEN: Soft, non-tender, non-distended MUSCULOSKELETAL:  Ambulates independently SKIN:  Warm and dry, no edema NEUROLOGIC:  Alert and oriented x 3. No focal neuro deficits noted. PSYCHIATRIC:  Normal affect    ASSESSMENT:    1. Chest pain, unspecified type   2. Cardiac risk counseling   3. Counseling on health promotion and disease prevention    PLAN:    Chest pain: has both typical and atypical components. She feels this is likely related to her GI issues but wants to be sure it isn't her heart. -discussed treadmill stress, nuclear stress/lexiscan, and CT coronary angiography. Discussed pros and cons of each, including but not limited to false positive/false negative risk, radiation risk, and risk of IV contrast dye. Based on shared decision making, decision was made to pursue exercise treadmill stress test -did discuss risk of false positive treadmill. If test is abnormal, would pursue CT coronary as next test.  Cardiac risk counseling and prevention recommendations: -recommend heart healthy/Mediterranean diet, with whole grains, fruits, vegetable, fish, lean meats, nuts, and olive oil. Limit salt. -recommend moderate walking, 3-5  times/week for 30-50 minutes each session. Aim for at least 150 minutes.week. Goal should be pace of 3 miles/hours, or walking 1.5 miles in 30 minutes -recommend avoidance of tobacco products. Avoid excess alcohol. -ASCVD risk score: The 10-year ASCVD risk score Mikey Bussing DC Brooke Bonito., et al., 2013) is: 5.9%   Values used to calculate the score:     Age: 63 years     Sex: Female     Is Non-Hispanic African American: Yes     Diabetic: No     Tobacco smoker: No     Systolic Blood Pressure: 426 mmHg     Is BP treated: No     HDL Cholesterol: 50 mg/dL     Total Cholesterol: 189 mg/dL    Plan for follow up: if testing is abnormal, will follow up quickly to discuss next steps. If stress test is normal, she prefers to follow up PRN with me and continue to work with her PCP with evaluation of her symptoms.  Medication Adjustments/Labs and Tests Ordered: Current medicines are reviewed at length with the patient today.  Concerns regarding medicines are outlined above.  Orders Placed This Encounter  Procedures   EXERCISE TOLERANCE TEST (ETT)   EKG 12-Lead   No orders of the defined types were placed in this encounter.   Patient Instructions  Medication Instructions:  Your Physician recommend you continue on your current medication as directed.    If you need a refill on your cardiac medications before your next appointment, please call your pharmacy.   Lab work: None  Testing/Procedures: Your physician has requested that you have an exercise tolerance test. For further information please visit [email or web address]. Please also follow instruction sheet, as given.  COVID Test [place],  [place] [address]   Follow-Up: At [facility], you and your health needs are our priority.  As part of our continuing mission to provide you with exceptional heart care, we have created designated Provider Care Teams.  These Care Teams include your primary Cardiologist (physician) and Advanced  Practice Providers (APPs -  Physician Assistants and Nurse Practitioners) who all work together to provide you with the care you need, when you need it. You will need a follow up appointment as needed.  Please call our office 2 months in advance to schedule this appointment.  You may see Dr. Harrell Gave or one of the following Advanced Practice Providers on your designated Care Team:   Rosaria Ferries, PA-C  Joni ReiningKathryn Lawrence, DNP, ANP    [facility] [address], [address] [place], [place] [address] Phone:  [phone]        You are scheduled for an Exercise Stress Test   Please arrive 15 minutes prior to your appointment time for registration and insurance purposes.  The test will take approximately 45 minutes to complete.  How to prepare for your Exercise Stress Test:  Do bring a list of your current medications with you.  If not listed below, you may take your medications as normal.  Do wear comfortable clothes (no dresses or overalls) and walking shoes, tennis shoes preferred (no heels or open toed shoes are allowed)  Do Not wear cologne, perfume, aftershave or lotions (deodorant is allowed).  [address] for your test.  If these instructions are not followed, your test will have to be rescheduled.  If you have questions or concerns about your appointment, you can call the Stress Lab at [phone].  If you cannot keep your appointment, please provide 24 hours notification to the Stress Lab, to avoid a possible $50 charge to your account       Signed, Jodelle RedBridgette Pheng Prokop, MD PhD 03/30/2019  [facility]

## 2019-03-31 ENCOUNTER — Encounter: Payer: Self-pay | Admitting: Cardiology

## 2019-04-08 ENCOUNTER — Telehealth (HOSPITAL_COMMUNITY): Payer: Self-pay | Admitting: *Deleted

## 2019-04-08 NOTE — Telephone Encounter (Signed)
Close encounter 

## 2019-04-09 ENCOUNTER — Other Ambulatory Visit (HOSPITAL_COMMUNITY)
Admission: RE | Admit: 2019-04-09 | Discharge: 2019-04-09 | Disposition: A | Discharge status: Home / Self Care | Payer: Medicare Other | Source: Ambulatory Visit | Attending: Cardiology | Admitting: Cardiology

## 2019-04-09 DIAGNOSIS — Z01812 Encounter for preprocedural laboratory examination: Secondary | ICD-10-CM | POA: Diagnosis present

## 2019-04-09 DIAGNOSIS — Z20828 Contact with and (suspected) exposure to other viral communicable diseases: Secondary | ICD-10-CM | POA: Insufficient documentation

## 2019-04-10 LAB — NOVEL CORONAVIRUS, NAA (HOSP ORDER, SEND-OUT TO REF LAB; TAT 18-24 HRS): SARS-CoV-2, NAA: NOT DETECTED

## 2019-04-12 ENCOUNTER — Telehealth (HOSPITAL_COMMUNITY): Payer: Self-pay

## 2019-04-12 NOTE — Telephone Encounter (Signed)
Encounter complete. 

## 2019-04-13 ENCOUNTER — Other Ambulatory Visit: Payer: Self-pay

## 2019-04-13 ENCOUNTER — Ambulatory Visit (HOSPITAL_COMMUNITY)
Admission: RE | Admit: 2019-04-13 | Discharge: 2019-04-13 | Disposition: A | Discharge status: Home / Self Care | Payer: Medicare Other | Source: Ambulatory Visit | Attending: Cardiovascular Disease | Admitting: Cardiovascular Disease

## 2019-04-13 DIAGNOSIS — R079 Chest pain, unspecified: Secondary | ICD-10-CM | POA: Insufficient documentation

## 2019-04-13 LAB — EXERCISE TOLERANCE TEST
Estimated workload: 6.8 METS
Exercise duration (min): 4 min
Exercise duration (sec): 51 s
MPHR: 159 {beats}/min
Peak HR: 141 {beats}/min
Percent HR: 89 %
RPE: 18
Rest HR: 86 {beats}/min

## 2019-04-14 ENCOUNTER — Other Ambulatory Visit: Payer: Self-pay

## 2019-04-14 ENCOUNTER — Ambulatory Visit (INDEPENDENT_AMBULATORY_CARE_PROVIDER_SITE_OTHER): Payer: Medicare Other | Admitting: Family Medicine

## 2019-04-14 ENCOUNTER — Encounter: Payer: Self-pay | Admitting: Family Medicine

## 2019-04-14 VITALS — BP 114/74 | HR 77 | Temp 98.3°F | Resp 14 | Wt 245.0 lb

## 2019-04-14 DIAGNOSIS — F439 Reaction to severe stress, unspecified: Secondary | ICD-10-CM | POA: Diagnosis not present

## 2019-04-14 DIAGNOSIS — R0600 Dyspnea, unspecified: Secondary | ICD-10-CM | POA: Diagnosis not present

## 2019-04-14 DIAGNOSIS — R05 Cough: Secondary | ICD-10-CM

## 2019-04-14 DIAGNOSIS — R0789 Other chest pain: Secondary | ICD-10-CM | POA: Diagnosis not present

## 2019-04-14 DIAGNOSIS — R059 Cough, unspecified: Secondary | ICD-10-CM

## 2019-04-14 DIAGNOSIS — R079 Chest pain, unspecified: Secondary | ICD-10-CM

## 2019-04-14 NOTE — Patient Instructions (Addendum)
No further omeprazole for now. Try pepcid over the counter for heartburn to see if that works better. See other foods below that can worsen heartburn, but follow up with gastroenterology as planned.   flonase daily may help postnasal drip and possibly cough as well.    I will refer you to pulmonary to evaluate the shortenss of breath and cough. Although unlikely, I think it is reasonable to screen for blood clots with a ddimer test. That test is ordered for a lab only visit to be drawn tomorrow morning if that works for your schedule.   See info below on chest wall pain. That can also cause chest pain. Tylenol if needed.   Thanks for coming in to see [place] today. Please let me know if there are questions.   Return to the clinic or go to the nearest emergency room if any of your symptoms worsen or new symptoms occur.   Chest Wall Pain Chest wall pain is pain in or around the bones and muscles of your chest. Sometimes, an injury causes this pain. Excessive coughing or overuse of arm and chest muscles may also cause chest wall pain. Sometimes, the cause may not be known. This pain may take several weeks or longer to get better. Follow these instructions at home: Managing pain, stiffness, and swelling   If directed, put ice on the painful area: ? Put ice in a plastic bag. ? Place a towel between your skin and the bag. ? Leave the ice on for 20 minutes, 2-3 times per day. Activity  Rest as told by your health care provider.  Avoid activities that cause pain. These include any activities that use your chest muscles or your abdominal and side muscles to lift heavy items. Ask your health care provider what activities are safe for you. General instructions   Take over-the-counter and prescription medicines only as told by your health care provider.  Do not use any products that contain nicotine or tobacco, such as cigarettes, e-cigarettes, and chewing tobacco. These can delay healing after  injury. If you need help quitting, ask your health care provider.  Keep all follow-up visits as told by your health care provider. This is important. Contact a health care provider if:  You have a fever.  Your chest pain becomes worse.  You have new symptoms. Get help right away if:  You have nausea or vomiting.  You feel sweaty or light-headed.  You have a cough with mucus from your lungs (sputum) or you cough up blood.  You develop shortness of breath. These symptoms may represent a serious problem that is an emergency. Do not wait to see if the symptoms will go away. Get medical help right away. Call your local emergency services (911 in the [place] Do not drive yourself to the hospital. Summary  Chest wall pain is pain in or around the bones and muscles of your chest.  Depending on the cause, it may be treated with ice, rest, medicines, and avoiding activities that cause pain.  Contact a health care provider if you have a fever, worsening chest pain, or new symptoms.  Get help right away if you feel light-headed or you develop shortness of breath. These symptoms may be an emergency. This information is not intended to replace advice given to you by your health care provider. Make sure you discuss any questions you have with your health care provider. Document Released: 07/14/2005 Document Revised: 01/14/2018 Document Reviewed: 01/14/2018 Elsevier Patient Education  2020  Elsevier Inc.    Food Choices for Gastroesophageal Reflux Disease, Adult When you have gastroesophageal reflux disease (GERD), the foods you eat and your eating habits are very important. Choosing the right foods can help ease your discomfort. Think about working with a nutrition specialist (dietitian) to help you make good choices. What are tips for following this plan?  Meals  Choose healthy foods that are low in fat, such as fruits, vegetables, whole grains, low-fat dairy products, and lean meat, fish,  and poultry.  Eat small meals often instead of 3 large meals a day. Eat your meals slowly, and in a place where you are relaxed. Avoid bending over or lying down until 2-3 hours after eating.  Avoid eating meals 2-3 hours before bed.  Avoid drinking a lot of liquid with meals.  Cook foods using methods other than frying. Bake, grill, or broil food instead.  Avoid or limit: ? Chocolate. ? Peppermint or spearmint. ? Alcohol. ? Pepper. ? Black and decaffeinated coffee. ? Black and decaffeinated tea. ? Bubbly (carbonated) soft drinks. ? Caffeinated energy drinks and soft drinks.  Limit high-fat foods such as: ? Fatty meat or fried foods. ? Whole milk, cream, butter, or ice cream. ? Nuts and nut butters. ? Pastries, donuts, and sweets made with butter or shortening.  Avoid foods that cause symptoms. These foods may be different for everyone. Common foods that cause symptoms include: ? Tomatoes. ? Oranges, lemons, and limes. ? Peppers. ? Spicy food. ? Onions and garlic. ? Vinegar. Lifestyle  Maintain a healthy weight. Ask your doctor what weight is healthy for you. If you need to lose weight, work with your doctor to do so safely.  Exercise for at least 30 minutes for 5 or more days each week, or as told by your doctor.  Wear loose-fitting clothes.  Do not smoke. If you need help quitting, ask your doctor.  Sleep with the head of your bed higher than your feet. Use a wedge under the mattress or blocks under the bed frame to raise the head of the bed. Summary  When you have gastroesophageal reflux disease (GERD), food and lifestyle choices are very important in easing your symptoms.  Eat small meals often instead of 3 large meals a day. Eat your meals slowly, and in a place where you are relaxed.  Limit high-fat foods such as fatty meat or fried foods.  Avoid bending over or lying down until 2-3 hours after eating.  Avoid peppermint and spearmint, caffeine, alcohol, and  chocolate. This information is not intended to replace advice given to you by your health care provider. Make sure you discuss any questions you have with your health care provider. Document Released: 01/13/2012 Document Revised: 11/04/2018 Document Reviewed: 08/19/2016 Elsevier Patient Education  [employer]2020 Elsevier Inc.    If you have lab work done today you will be contacted with your lab results within the next 2 weeks.  If you have not heard from us then please contact us. The fastest way to get your results is to register for My Chart.   IF you received an x-ray today, you will receive an invoice from [facility]. Please contact [facility] at [phone] with questions or concerns regarding your invoice.   IF you received labwork today, you will receive an invoice from [facility]. Please contact [facility] at [phone] with questions or concerns regarding your invoice.   Our billing staff will not be able to assist you with questions regarding bills from these  companies.  You will be contacted with the lab results as soon as they are available. The fastest way to get your results is to activate your My Chart account. Instructions are located on the last page of this paperwork. If you have not heard from [place] regarding the results in 2 weeks, please contact this office.

## 2019-04-14 NOTE — Progress Notes (Signed)
Subjective:    Patient ID: Misty Burke, female    DOB: 24-May-1956, 63 y.o.   MRN: [id number]  HPI Misty Burke is a 63 y.o. female Presents today for: Chief Complaint  Patient presents with   Follow-up    was placed on omeprozole but after 5 day stopped this med because it caused stabbing pain in chest. After stopping the med the chest pain and pressure stopped. Then when tried med again the pain and pressure came back   depression/stress symptoms:  -Handout given on stress management last visit as well as recommended counseling.   - feels like stress has improved- did not call counselor.   -cut back on some extra activities or stressors. Feels like less burnt out.   chest pain  -See last office visit, attempted omeprazole but stopped as above due to increasing chest symptoms when using that medication.  Was referred to cardiology as well as gastroenterology.  -Cardiology eval September 2.  Typical and atypical symptoms.  Exercise tolerance test ordered, completed yesterday. 6.8 METS, maximum heart rate 141 which was 89% of maximal age-predicted heart rate.  Exercise stress test was stopped due to shortness of breath.  Took about 4-5 days of omeprazole, but noticed pain in chest -felt like heart attack. Center of chest, stabbing pain. 10/10, no radiation ot arm/neck/back. Subsided after drinking large amount of water.  No associated n/v/sweating.  Was more short of breath. Similar sx's when tried omeprazole again few days later.  Dyspnea at times throughout the day- off and on - provokes cough.  appt with GI on 9/28. Still belching a lot.  Some postnasal drip. Not using allergy meds routinely. Saline   No recent prolonged car travel or air travel, both calf swelling at times. Not unilateral, some peripheral edema if increased salt in past.      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 Authorizing Provider  fluticasone (FLONASE) 50 MCG/ACT nasal spray Place 1-2 sprays into both nostrils at bedtime. 03/30/18  Yes Shade Flood, MD  ipratropium (ATROVENT) 0.06 % nasal spray Place 1-2 sprays into both nostrils 4 (four) times daily as needed for rhinitis. 03/30/18  Yes Shade Flood, MD   Social History   Socioeconomic History   Marital status: Single    Spouse name: Not on file   Number of children: Not on file   Years of education: Not on file   Highest education level: Not on file  Occupational History   Not on file  Social Needs   Financial resource strain: Not on file   Food insecurity    Worry: Not on file    Inability: Not on file   Transportation needs    Medical: Not on file    Non-medical: Not on file  Tobacco Use   Smoking status: Never Smoker   Smokeless tobacco: Never Used  Substance and Sexual Activity   Alcohol use: Yes   Drug use: No   Sexual activity: Not Currently  Lifestyle  Physical activity    Days per week: Not on file    Minutes per session: Not on file   Stress: Not on file  Relationships   Social connections    Talks on phone: Not on file    Gets together: Not on file    Attends religious service: Not on file    Active member of club or organization: Not on file    Attends meetings of clubs or organizations: Not on file    Relationship status: Not on file   Intimate partner violence    Fear of current or ex partner: Not on file    Emotionally abused: Not on file    Physically abused: Not on file    Forced sexual activity: Not on file  Other Topics Concern   Not on file  Social  History Narrative   Not on file    Review of Systems Per HPI.     Objective:   Physical Exam Vitals signs reviewed.  Constitutional:      Appearance: She is well-developed.  HENT:     Head: Normocephalic and atraumatic.  Eyes:     Conjunctiva/sclera: Conjunctivae normal.     Pupils: Pupils are equal, round, and reactive to light.  Neck:     Vascular: No carotid bruit.  Cardiovascular:     Rate and Rhythm: Normal rate and regular rhythm.     Heart sounds: Normal heart sounds.  Pulmonary:     Effort: Pulmonary effort is normal.     Breath sounds: Normal breath sounds.  Chest:     Chest wall: Tenderness present.    Abdominal:     Palpations: Abdomen is soft. There is no pulsatile mass.     Tenderness: There is no abdominal tenderness.  Musculoskeletal:     Comments: Calves nontender, no focal ttp. negative Homans.   Skin:    General: Skin is warm and dry.  Neurological:     Mental Status: She is alert and oriented to person, place, and time.  Psychiatric:        Behavior: Behavior normal.    Vitals:   04/14/19 1506  BP: 114/74  Pulse: 77  Resp: 14  Temp: 98.3 F (36.8 C)  TempSrc: Oral  SpO2: 98%  Weight: 245 lb (111.1 kg)       Assessment & Plan:   Misty Burke is a 63 y.o. female Chest pressure - Plan: D-dimer, quantitative (not at [facility]) Dyspnea, unspecified type - Plan: D-dimer, quantitative (not at [facility]), Ambulatory referral to Pulmonology Chest pain, unspecified type - Plan: D-dimer, quantitative (not at [facility]), Ambulatory referral to Pulmonology  -Borderline d-dimer but age-adjusted to likely normal.  Referral to pulmonary, continue follow-up with gastroenterology as planned.  Avoid omeprazole given prior concerns, trial of Pepcid if needed.  ER/RTC precautions if acute worsening of symptoms.  -Some reproducible component/chest wall pain.  Handout given.  Symptomatic care with RTC precautions given.  Cough - Plan: Ambulatory referral to  Pulmonology  -Flonase discussed, as possible postnasal drip component.  Pulmonary eval ordered.  Situational stress  -Reports improvement in symptoms.  RTC precautions if worsening  No orders of the defined types were placed in this encounter.  Patient Instructions   No further omeprazole for now. Try pepcid over the counter for heartburn to see if that works better. See other foods below that can worsen heartburn, but follow up with gastroenterology as planned.   flonase daily may help postnasal drip and possibly cough  as well.    I will refer you to pulmonary to evaluate the shortenss of breath and cough. Although unlikely, I think it is reasonable to screen for blood clots with a ddimer test. That test is ordered for a lab only visit to be drawn tomorrow morning if that works for your schedule.   See info below on chest wall pain. That can also cause chest pain. Tylenol if needed.   Thanks for coming in to see us today. Please let me know if there are questions.   Return to the clinic or go to the nearest emergency room if any of your symptoms worsen or new symptoms occur.   Chest Wall Pain Chest wall pain is pain in or around the bones and muscles of your chest. Sometimes, an injury causes this pain. Excessive coughing or overuse of arm and chest muscles may also cause chest wall pain. Sometimes, the cause may not be known. This pain may take several weeks or longer to get better. Follow these instructions at home: Managing pain, stiffness, and swelling   If directed, put ice on the painful area: ? Put ice in a plastic bag. ? Place a towel between your skin and the bag. ? Leave the ice on for 20 minutes, 2-3 times per day. Activity  Rest as told by your health care provider.  Avoid activities that cause pain. These include any activities that use your chest muscles or your abdominal and side muscles to lift heavy items. Ask your health care provider what activities are safe  for you. General instructions   Take over-the-counter and prescription medicines only as told by your health care provider.  Do not use any products that contain nicotine or tobacco, such as cigarettes, e-cigarettes, and chewing tobacco. These can delay healing after injury. If you need help quitting, ask your health care provider.  Keep all follow-up visits as told by your health care provider. This is important. Contact a health care provider if:  You have a fever.  Your chest pain becomes worse.  You have new symptoms. Get help right away if:  You have nausea or vomiting.  You feel sweaty or light-headed.  You have a cough with mucus from your lungs (sputum) or you cough up blood.  You develop shortness of breath. These symptoms may represent a serious problem that is an emergency. Do not wait to see if the symptoms will go away. Get medical help right away. Call your local emergency services (911 in the [place] Do not drive yourself to the hospital. Summary  Chest wall pain is pain in or around the bones and muscles of your chest.  Depending on the cause, it may be treated with ice, rest, medicines, and avoiding activities that cause pain.  Contact a health care provider if you have a fever, worsening chest pain, or new symptoms.  Get help right away if you feel light-headed or you develop shortness of breath. These symptoms may be an emergency. This information is not intended to replace advice given to you by your health care provider. Make sure you discuss any questions you have with your health care provider. Document Released: 07/14/2005 Document Revised: 01/14/2018 Document Reviewed: 01/14/2018 Elsevier Patient Education  2020 [employer]    Food Choices for Gastroesophageal Reflux Disease, Adult When you have gastroesophageal reflux disease (GERD), the foods you eat and your eating habits are very important. Choosing the right foods can help ease your  discomfort. Think about working with  a nutrition specialist (dietitian) to help you make good choices. What are tips for following this plan?  Meals  Choose healthy foods that are low in fat, such as fruits, vegetables, whole grains, low-fat dairy products, and lean meat, fish, and poultry.  Eat small meals often instead of 3 large meals a day. Eat your meals slowly, and in a place where you are relaxed. Avoid bending over or lying down until 2-3 hours after eating.  Avoid eating meals 2-3 hours before bed.  Avoid drinking a lot of liquid with meals.  Cook foods using methods other than frying. Bake, grill, or broil food instead.  Avoid or limit: ? Chocolate. ? Peppermint or spearmint. ? Alcohol. ? Pepper. ? Black and decaffeinated coffee. ? Black and decaffeinated tea. ? Bubbly (carbonated) soft drinks. ? Caffeinated energy drinks and soft drinks.  Limit high-fat foods such as: ? Fatty meat or fried foods. ? Whole milk, cream, butter, or ice cream. ? Nuts and nut butters. ? Pastries, donuts, and sweets made with butter or shortening.  Avoid foods that cause symptoms. These foods may be different for everyone. Common foods that cause symptoms include: ? Tomatoes. ? Oranges, lemons, and limes. ? Peppers. ? Spicy food. ? Onions and garlic. ? Vinegar. Lifestyle  Maintain a healthy weight. Ask your doctor what weight is healthy for you. If you need to lose weight, work with your doctor to do so safely.  Exercise for at least 30 minutes for 5 or more days each week, or as told by your doctor.  Wear loose-fitting clothes.  Do not smoke. If you need help quitting, ask your doctor.  Sleep with the head of your bed higher than your feet. Use a wedge under the mattress or blocks under the bed frame to raise the head of the bed. Summary  When you have gastroesophageal reflux disease (GERD), food and lifestyle choices are very important in easing your symptoms.  Eat small  meals often instead of 3 large meals a day. Eat your meals slowly, and in a place where you are relaxed.  Limit high-fat foods such as fatty meat or fried foods.  Avoid bending over or lying down until 2-3 hours after eating.  Avoid peppermint and spearmint, caffeine, alcohol, and chocolate. This information is not intended to replace advice given to you by your health care provider. Make sure you discuss any questions you have with your health care provider. Document Released: 01/13/2012 Document Revised: 11/04/2018 Document Reviewed: 08/19/2016 Elsevier Patient Education  [employer]2020 [employer] Inc.    If you have lab work done today you will be contacted with your lab results within the next 2 weeks.  If you have not heard from us then please contact us. The fastest way to get your results is to register for My Chart.   IF you received an x-ray today, you will receive an invoice from [facility]. Please contact [facility] at [phone] with questions or concerns regarding your invoice.   IF you received labwork today, you will receive an invoice from [facility]. Please contact [facility] at [phone] with questions or concerns regarding your invoice.   Our billing staff will not be able to assist you with questions regarding bills from these companies.  You will be contacted with the lab results as soon as they are available. The fastest way to get your results is to activate your My Chart account. Instructions are located on the last page of this paperwork. If you have not  heard from [place] regarding the results in 2 weeks, please contact this office.       Signed,   Merri Ray, MD [facility].  04/19/19 11:38 AM

## 2019-04-15 ENCOUNTER — Ambulatory Visit (INDEPENDENT_AMBULATORY_CARE_PROVIDER_SITE_OTHER): Payer: Medicare Other | Admitting: Family Medicine

## 2019-04-15 DIAGNOSIS — R0789 Other chest pain: Secondary | ICD-10-CM

## 2019-04-15 DIAGNOSIS — R079 Chest pain, unspecified: Secondary | ICD-10-CM

## 2019-04-15 DIAGNOSIS — R0600 Dyspnea, unspecified: Secondary | ICD-10-CM

## 2019-04-16 LAB — D-DIMER, QUANTITATIVE: D-DIMER: 0.53 mg/L FEU — ABNORMAL HIGH (ref 0.00–0.49)

## 2019-04-18 ENCOUNTER — Other Ambulatory Visit: Payer: Self-pay | Admitting: Family Medicine

## 2019-04-18 DIAGNOSIS — J309 Allergic rhinitis, unspecified: Secondary | ICD-10-CM

## 2019-04-18 NOTE — Telephone Encounter (Signed)
Requested medication (s) are due for refill today: yes  Requested medication (s) are on the active medication list: yes  Last refill:  03/30/18  Future visit scheduled: {No  Notes to clinic:  Off protocol expired rx    Requested Prescriptions  Pending Prescriptions Disp Refills   ipratropium (ATROVENT) 0.06 % nasal spray [Pharmacy Med Name: IPRATROPIUM 0.06% SPRAY] 15 mL 1    Sig: Place 1-2 sprays into both nostrils 4 (four) times daily as needed for rhinitis.     Off-Protocol Failed - 04/18/2019  4:21 PM      Failed - Medication not assigned to a protocol, review manually.      Passed - Valid encounter within last 12 months    Recent Outpatient Visits          3 days ago Chest pressure   [facility], Arlie Solomons, MD   4 days ago Chest pressure   [facility], Ranell Patrick, MD   3 weeks ago Routine physical examination   [facility], Ranell Patrick, MD   1 year ago Allergic rhinitis, unspecified seasonality, unspecified trigger   [facility], Ranell Patrick, MD   2 years ago Peripheral edema   [facility] [place], PA-C            Off-Protocol Failed - 04/18/2019  4:21 PM      Failed - Medication not assigned to a protocol, review manually.      Passed - Valid encounter within last 12 months    Recent Outpatient Visits          3 days ago Chest pressure   [facility], Arlie Solomons, MD   4 days ago Chest pressure   [facility], Ranell Patrick, MD   3 weeks ago Routine physical examination   [facility], Ranell Patrick, MD   1 year ago Allergic rhinitis, unspecified seasonality, unspecified trigger   [facility], Ranell Patrick, MD   2 years ago Peripheral edema   [facility] at [facility], Audrie Lia, PA-C              fluticasone ([place]) 50 MCG/ACT nasal spray [Pharmacy Med Name: FLUTICASONE PROP 50 MCG SPRAY] 16 mL 6    Sig:  PLACE 1 TO 2 SPRAYS INTO BOTH NOSTRILS AT BEDTIME     Ear, Nose, and Throat: Nasal Preparations - Corticosteroids Passed - 04/18/2019  4:21 PM      Passed - Valid encounter within last 12 months    Recent Outpatient Visits          3 days ago Chest pressure   [facility] at [facility], Arlie Solomons, MD   4 days ago Chest pressure   [facility], Ranell Patrick, MD   3 weeks ago Routine physical examination   [facility], Ranell Patrick, MD   1 year ago Allergic rhinitis, unspecified seasonality, unspecified trigger   [facility], Ranell Patrick, MD   2 years ago Peripheral edema   [facility] [place], PA-C

## 2019-04-19 ENCOUNTER — Encounter: Payer: Self-pay | Admitting: Family Medicine

## 2019-04-22 ENCOUNTER — Ambulatory Visit (INDEPENDENT_AMBULATORY_CARE_PROVIDER_SITE_OTHER): Payer: Medicare Other | Admitting: Internal Medicine

## 2019-04-22 ENCOUNTER — Telehealth: Payer: Self-pay | Admitting: Internal Medicine

## 2019-04-22 ENCOUNTER — Encounter: Payer: Self-pay | Admitting: Internal Medicine

## 2019-04-22 ENCOUNTER — Other Ambulatory Visit: Payer: Self-pay

## 2019-04-22 DIAGNOSIS — Q791 Other congenital malformations of diaphragm: Secondary | ICD-10-CM

## 2019-04-22 DIAGNOSIS — R0609 Other forms of dyspnea: Secondary | ICD-10-CM

## 2019-04-22 LAB — CBC WITH DIFFERENTIAL/PLATELET
Basophils Absolute: 0.1 10*3/uL (ref 0.0–0.1)
Basophils Relative: 0.6 % (ref 0.0–3.0)
Eosinophils Absolute: 0.2 10*3/uL (ref 0.0–0.7)
Eosinophils Relative: 2.4 % (ref 0.0–5.0)
HCT: 41.4 % (ref 36.0–46.0)
Hemoglobin: 13.5 g/dL (ref 12.0–15.0)
Lymphocytes Relative: 19 % (ref 12.0–46.0)
Lymphs Abs: 1.7 10*3/uL (ref 0.7–4.0)
MCHC: 32.6 g/dL (ref 30.0–36.0)
MCV: 84.3 fl (ref 78.0–100.0)
Monocytes Absolute: 0.6 10*3/uL (ref 0.1–1.0)
Monocytes Relative: 6.2 % (ref 3.0–12.0)
Neutro Abs: 6.5 10*3/uL (ref 1.4–7.7)
Neutrophils Relative %: 71.8 % (ref 43.0–77.0)
Platelets: 216 10*3/uL (ref 150.0–400.0)
RBC: 4.91 Mil/uL (ref 3.87–5.11)
RDW: 15.3 % (ref 11.5–15.5)
WBC: 9 10*3/uL (ref 4.0–10.5)

## 2019-04-22 LAB — SARS-COV-2 IGG: SARS-COV-2 IgG: 0.06

## 2019-04-22 NOTE — Telephone Encounter (Signed)
Called and spoke w/ pt regarding her lab results from today 04/22/2019. Pt expressed understanding with no additional questions. Result note has been updated to reflect this. Nothing further needed at this time.

## 2019-04-22 NOTE — Progress Notes (Signed)
Misty Burke, female    DOB: 03/27/1956,     MRN: [id number]   Brief patient profile:  [age] yobf never smoker never resp symptoms then early Feb 2020 severe dry cough /shortness of breath a day or two  p exposure to civil rights convention with lots of people from out of town  X 2 weeks stayed home and gradually 100% better = yardwork, housekeeping, steps  until late July 2020 with severe anxiety at rest assoc dry cough then developed overt HB > eval by Dr Albertine PatriciaJeff Green with nl cxr >rx omeprzole > cp midline  Better p stopped ppi and  p swallowing water  and referred to pulmonary clinic 04/22/2019 by Dr   Neva SeatGreene.      History of Present Illness  04/22/2019  Pulmonary/ 1st office eval/[facility]  Chief Complaint  Patient presents with  . Consult    Shortness of breath, chronic cough since August. Has had two covid tests, both were negative.  Dyspnea: assoc with midline cp  7/10 and better with drinking water, not worse with exertion nor is sob proportionate to activity  / cp resolved p stopped ppi and recurred when rechallenged. Referred to   gi per office notes with cards eval neg setp 2 to include gxt 04/13/19  Cough: better now Sleep: cough drop at hs  SABA use: none   No obvious patterns in  day to day or daytime variability or assoc excess/ purulent sputum or mucus plugs or hemoptysis  , subjective wheeze or overt sinus   symptoms.   Sleeping fine now without nocturnal  or early am exacerbation  of respiratory  c/o's or need for noct saba. Also denies any obvious fluctuation of symptoms with weather or environmental changes or other aggravating or alleviating factors except as outlined above   No unusual exposure hx or h/o childhood pna/ asthma or knowledge of premature birth.  Current Allergies, Complete Past Medical History, Past Surgical History, Family History, and Social History were reviewed in [facility] electronic medical record.  ROS  The following are not active complaints unless  bolded Hoarseness, sore throat, dysphagia, dental problems, itching, sneezing,  nasal congestion or discharge of excess mucus or purulent secretions, ear ache,   fever, chills, sweats, unintended wt loss or wt gain, classically pleuritic or exertional cp,  orthopnea pnd or arm/hand swelling  or leg swelling, presyncope, palpitations, abdominal pain, anorexia, nausea, vomiting, diarrhea  or change in bowel habits or change in bladder habits, change in stools or change in urine, dysuria, hematuria,  rash, arthralgias, visual complaints, headache, numbness, weakness or ataxia or problems with walking or coordination,  change in mood or  memory.             Past Medical History:  Diagnosis Date  . Angio-edema   . Depression   . Urticaria     Outpatient Medications Prior to Visit -   Medication Sig Dispense Refill  . fluticasone (FLONASE) 50 MCG/ACT nasal spray PLACE 1 TO 2 SPRAYS INTO BOTH NOSTRILS AT BEDTIME 16 mL 6  . ipratropium (ATROVENT) 0.06 % nasal spray PLACE 1-2 SPRAYS INTO BOTH NOSTRILS 4 (FOUR) TIMES DAILY AS NEEDED FOR RHINITIS. 15 mL 1      Objective:     BP 122/84 (BP Location: Left Arm, Patient Position: Sitting, Cuff Size: Normal)   Pulse 74   Temp (!) 97.1 F (36.2 C)   Ht 5\' 9"  (1.753 m)   Wt 243 lb (110.2 kg)  SpO2 100% Comment: on room air  BMI 35.88 kg/m   SpO2: 100 %(on room air)   Pleasant amb bf nad    HEENT : pt wearing mask not removed for exam due to covid -19 concerns.    NECK :  without JVD/Nodes/TM/ nl carotid upstrokes bilaterally   LUNGS: no acc muscle use,  Nl contour chest which is clear to A and P bilaterally without cough on insp or exp maneuvers   CV:  RRR  no s3 or murmur or increase in P2, and no edema   ABD:  soft and nontender with nl inspiratory excursion in the supine position. No bruits or organomegaly appreciated, bowel sounds nl  MS:  Nl gait/ ext warm without deformities, calf tenderness, cyanosis or clubbing No obvious  joint restrictions   SKIN: warm and dry without lesions    NEURO:  alert, approp, nl sensorium with  no motor or cerebellar deficits apparent.    I personally reviewed images and agree with radiology impression as follows:  CXR:   03/25/2019 The heart size and mediastinal contours are within normal limits. Both lungs are clear. Unchanged eventration of the right Hemidiaphragm   Labs ordered 04/22/2019  :  allergy profile   -  Covid IgG antibody     Lab Results  Component Value Date   WBC 9.0 04/22/2019   HGB 13.5 04/22/2019   HCT 41.4 04/22/2019   MCV 84.3 04/22/2019   PLT 216.0 04/22/2019       EOS                                                               0.2                                    04/22/2019     Assessment   DOE (dyspnea on exertion) July 2020 with midline cp worse with ppi  better with drinking water  - GXT 04/23/19 neg for ischemia  - 04/22/2019   Walked RA  3 laps @  approx 265ft each @ brisk pace  stopped due to end of study, no sob at end and sats still 97%   - 04/22/2019 covid IgG neg   Worse cp with ppi can be seen with certain PPI's and likely represents IBS, not refractory gerd or esophageal spasm, in my experience and is almost never seen with aciphex, but since she's better now I see no need to empirically rechallenge her or rx with anything but gerd diet, which should not include hs mints - see avs for instructions unique to this ov    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  Advised on conditioning exercises  F/u in 6 m, sooner if needed   Total time devoted to counseling  > 50 % of initial 60 min office visit:  reviewed case with pt/ directly observed  portions of ambulatory 02 saturation study/  discussion of options/alternatives/ personally creating written customized instructions  in presence of pt  then going over those specific  Instructions directly with the pt including how to use all of the meds but in particular covering each new medication in detail and the difference between the maintenance= "automatic" meds and the prns using an action plan format for the latter (If this problem/symptom => do that organization reading Left to right).  Please see AVS from this visit for a full list of these instructions which I personally wrote for this pt and  are unique to this visit.      Christinia Gully, MD 04/22/2019

## 2019-04-22 NOTE — Patient Instructions (Addendum)
Weight control is simply a matter of calorie balance which needs to be tilted in your favor by eating less and exercising more.  To get the most out of exercise, you need to be continuously aware that you are short of breath, but never out of breath, for 30 minutes daily. As you improve, it will actually be easier for you to do the same amount of exercise  in  30 minutes so always push to the level where you are short of breath.  If this does not result in gradual weight reduction then I strongly recommend you see a nutritionist with a food diary x 2 weeks so that we can work out a negative calorie balance which is universally effective in steady weight loss programs.  Think of your calorie balance like you do your bank account where in this case you want the balance to go down so you must take in less calories than you burn up.  It's just that simple:  Hard to do, but easy to understand.  Good luck!   GERD (REFLUX)  is an extremely common cause of respiratory symptoms just like yours , many times with no obvious heartburn at all.    It can be treated with medication, but also with lifestyle changes including elevation of the head of your bed (ideally with 6 -8inch blocks under the headboard of your bed),  Smoking cessation, avoidance of late meals, excessive alcohol, and avoid fatty foods, chocolate, peppermint, colas, red wine, and acidic juices such as orange juice.  NO MINT OR MENTHOL PRODUCTS SO NO COUGH DROPS  USE SUGARLESS CANDY INSTEAD (Jolley ranchers or Stover's or Life Savers) or even ice chips will also do - the key is to swallow to prevent all throat clearing. NO OIL BASED VITAMINS - use powdered substitutes.  Avoid fish oil when coughing.    Please remember to go to the lab department   for your tests - we will call you with the results when they are available.  Please schedule a follow up office visit in 6 weeks, call sooner if needed

## 2019-04-24 ENCOUNTER — Encounter: Payer: Self-pay | Admitting: Internal Medicine

## 2019-04-24 DIAGNOSIS — Q791 Other congenital malformations of diaphragm: Secondary | ICD-10-CM | POA: Insufficient documentation

## 2019-04-24 NOTE — Assessment & Plan Note (Signed)
Images reviewed with the pt indicating present sinc 02/20/14 and likely congenital and of no importance other than emphasizing wt gain will make her more symptomatic as she ages as it will further compromise excursion of the R HD which is usually the more important of the two diaphragms.  Advised on conditioning exercises  F/u in 6 m, sooner if needed   Total time devoted to counseling  > 50 % of initial 60 min office visit:  reviewed case with pt/ directly observed portions of ambulatory 02 saturation study/ discussion of options/alternatives/ personally creating written customized instructions  in presence of pt  then going over those specific  Instructions directly with the pt including how to use all of the meds but in particular covering each new medication in detail and the difference between the maintenance= "automatic" meds and the prns using an action plan format for the latter (If this problem/symptom => do that organization reading Left to right).  Please see AVS from this visit for a full list of these instructions which I personally wrote for this pt and  are unique to this visit.

## 2019-04-24 NOTE — Assessment & Plan Note (Addendum)
July 2020 with midline cp worse with ppi  better with drinking water  - GXT 04/23/19 neg for ischemia  - 04/22/2019   Walked RA  3 laps @  approx 264ft each @ brisk pace  stopped due to end of study, no sob at end and sats still 97%   - 04/22/2019 covid IgG neg   Sob present at rest and not reproduced with activity or proportionate to activity is typical of anxiety related breathing disorders - reviewed concept that lungs are like rubber bands that need to recoil to where they don't have tension on them before attempting to take another breath, and the best way to treat this problem is regular submax ex with f/u cpst a consideration if not doing better at f/u in 6 weeks.   Worse cp with ppi can be seen with certain PPI's and likely represents IBS, not refractory gerd or esophageal spasm, in my experience and is almost never seen with aciphex, but since she's better now I see no need to empirically rechallenge her or rx with anything but gerd diet, which should not include hs mints - see avs for instructions unique to this ov

## 2019-04-25 ENCOUNTER — Other Ambulatory Visit: Payer: Self-pay

## 2019-04-25 ENCOUNTER — Ambulatory Visit (INDEPENDENT_AMBULATORY_CARE_PROVIDER_SITE_OTHER): Payer: Medicare Other | Admitting: Physician Assistant

## 2019-04-25 ENCOUNTER — Encounter: Payer: Self-pay | Admitting: Physician Assistant

## 2019-04-25 VITALS — BP 127/77 | HR 84 | Temp 97.5°F | Ht 70.0 in | Wt 242.0 lb

## 2019-04-25 DIAGNOSIS — Z1211 Encounter for screening for malignant neoplasm of colon: Secondary | ICD-10-CM | POA: Diagnosis not present

## 2019-04-25 DIAGNOSIS — R140 Abdominal distension (gaseous): Secondary | ICD-10-CM

## 2019-04-25 DIAGNOSIS — R194 Change in bowel habit: Secondary | ICD-10-CM | POA: Diagnosis not present

## 2019-04-25 LAB — IGE: IgE (Immunoglobulin E), Serum: 211 kU/L — ABNORMAL HIGH (ref <=114)

## 2019-04-25 MED ORDER — SUPREP BOWEL PREP KIT 17.5-3.13-1.6 GM/177ML PO SOLN: 1.0000 | ORAL | 0 refills | Status: DC

## 2019-04-25 NOTE — Progress Notes (Signed)
Chief Complaint: Abdominal bloating, change in bowel habits  HPI:    Misty Burke is a 63 year old African-American female with a past medical history as listed below, who was referred to me by Wendie Agreste, MD for a complaint of abdominal bloating, change in bowel habits.     03/25/2019 CMP normal.  04/22/2019 CBC normal.  COVID negative.    Per chart review patient has been seeing Dr. Melvyn Novas in pulmonology for dyspnea on exertion.  This is thought due to congenital malformation of her diaphragm and weight gain.  04/13/2019 also seen by cardiology for work-up of this atypical chest pain, negative stress test and negative work-up.    Today, the patient explains that in August "everything started to fall apart".  Describes starting with a chest pain which radiated up into her throat and also some shortness of breath and a cough whenever she tried to breathe.  This has all been worked up and negative as above by cardiology and pulmonology.  She was told to see our clinic in regards to ongoing symptoms.      Today, the patient tells me that by about the third week in September she started to feel some better.  Tells me her bowel habits are radiating back and forth from constipation to diarrhea and now she is only having 1 bowel movement a day which sometimes does not feel like it empties her, previously her normal was 3 bowel movements a day.  Tells me she drinks prune juice when she feels really backed up and this "just clears me out".  Associated symptoms include some bloating.    Also describes a feeling of "gas stopping in my throat".  Tells me she does not have true dysphagia but feels as though when she burps it gets caught.  Apparently took Omeprazole 3 or 4 pills and developed severe chest pain which made her feel like she was having a heart attack, she drank 3 glasses of water and this pain went away but when she tried to take another pill the same chest pain came back.    Overall the patient  actually feels some better but is very confused as to why she went from feeling completely healthy to then having all of these symptoms.  She would like further work-up to make sure that no one is missing anything.  Tells me that if we do not find anything she does not know what she will do next.    History of EGD and colonoscopy back in 2005 in [place], these were normal per patient.    Denies fever, chills, blood in her stool, weight loss or symptoms that awaken her from sleep.  Past Medical History:  Diagnosis Date  . Angio-edema   . Depression   . Urticaria     Past Surgical History:  Procedure Laterality Date  . BREAST CYST ASPIRATION Left   . NO PAST SURGERIES      Current Outpatient Medications  Medication Sig Dispense Refill  . fluticasone (FLONASE) 50 MCG/ACT nasal spray PLACE 1 TO 2 SPRAYS INTO BOTH NOSTRILS AT BEDTIME 16 mL 6  . ipratropium (ATROVENT) 0.06 % nasal spray PLACE 1-2 SPRAYS INTO BOTH NOSTRILS 4 (FOUR) TIMES DAILY AS NEEDED FOR RHINITIS. 15 mL 1   No current facility-administered medications for this visit.     Allergies as of 04/25/2019  . (No Known Allergies)    Family History  Problem Relation Age of Onset  . Cancer Other   .  Cancer Father   . Diabetes Neg Hx   . Hypertension Neg Hx   . Breast cancer Neg Hx     Social History   Socioeconomic History  . Marital status: Single    Spouse name: Not on file  . Number of children: Not on file  . Years of education: Not on file  . Highest education level: Not on file  Occupational History  . Not on file  Social Needs  . Financial resource strain: Not on file  . Food insecurity    Worry: Not on file    Inability: Not on file  . Transportation needs    Medical: Not on file    Non-medical: Not on file  Tobacco Use  . Smoking status: Never Smoker  . Smokeless tobacco: Never Used  Substance and Sexual Activity  . Alcohol use: Yes  . Drug use: No  . Sexual activity: Not Currently   Lifestyle  . Physical activity    Days per week: Not on file    Minutes per session: Not on file  . Stress: Not on file  Relationships  . Social Musician on phone: Not on file    Gets together: Not on file    Attends religious service: Not on file    Active member of club or organization: Not on file    Attends meetings of clubs or organizations: Not on file    Relationship status: Not on file  . Intimate partner violence    Fear of current or ex partner: Not on file    Emotionally abused: Not on file    Physically abused: Not on file    Forced sexual activity: Not on file  Other Topics Concern  . Not on file  Social History Narrative  . Not on file    Review of Systems:    Constitutional: No weight loss, fever or chills Skin: No rash Cardiovascular: No palpitations   Respiratory: +DOE and cough Gastrointestinal: See HPI and otherwise negative Genitourinary: No dysuria  Neurological: No headache, dizziness or syncope Musculoskeletal: No new muscle or joint pain Hematologic: No bleeding  Psychiatric: No history of depression or anxiety   Physical Exam:  Vital signs: BP 127/77   Pulse 84   Temp (!) 97.5 F (36.4 C)   Ht 5\' 10"  (1.778 m)   Wt 242 lb (109.8 kg)   SpO2 95%   BMI 34.72 kg/m   Constitutional:   Pleasant AA female appears to be in NAD, Well developed, Well nourished, alert and cooperative Head:  Normocephalic and atraumatic. Eyes:   PEERL, EOMI. No icterus. Conjunctiva pink. Ears:  Normal auditory acuity. Neck:  Supple Throat: Oral cavity and pharynx without inflammation, swelling or lesion.  Respiratory: Respirations even and unlabored. Lungs clear to auscultation bilaterally.   No wheezes, crackles, or rhonchi.  Cardiovascular: Normal S1, S2. No MRG. Regular rate and rhythm. No peripheral edema, cyanosis or pallor.  Gastrointestinal:  Soft, nondistended,mild epigastric ttp. No rebound or guarding. Normal bowel sounds. No appreciable  masses or hepatomegaly. Rectal:  Not performed.  Msk:  Symmetrical without gross deformities. Without edema, no deformity or joint abnormality.  Neurologic:  Alert and  oriented x4;  grossly normal neurologically.  Skin:   Dry and intact without significant lesions or rashes. Psychiatric: Demonstrates good judgement and reason without abnormal affect or behaviors.  MOST RECENT LABS AND IMAGING: CBC    Component Value Date/Time   WBC 9.0 04/22/2019 1138  RBC 4.91 04/22/2019 1138   HGB 13.5 04/22/2019 1138   HCT 41.4 04/22/2019 1138   PLT 216.0 04/22/2019 1138   MCV 84.3 04/22/2019 1138   MCV 85.0 03/25/2019 1656   MCH 27.7 03/25/2019 1656   MCHC 32.6 04/22/2019 1138   RDW 15.3 04/22/2019 1138   LYMPHSABS 1.7 04/22/2019 1138   MONOABS 0.6 04/22/2019 1138   EOSABS 0.2 04/22/2019 1138   BASOSABS 0.1 04/22/2019 1138    CMP     Component Value Date/Time   NA 143 03/25/2019 1654   K 4.9 03/25/2019 1654   CL 106 03/25/2019 1654   CO2 23 03/25/2019 1654   GLUCOSE 81 03/25/2019 1654   GLUCOSE 88 03/01/2014 2001   BUN 13 03/25/2019 1654   CREATININE 1.11 (H) 03/25/2019 1654   CALCIUM 9.6 03/25/2019 1654   PROT 7.2 03/25/2019 1654   ALBUMIN 4.5 03/25/2019 1654   AST 21 03/25/2019 1654   ALT 17 03/25/2019 1654   ALKPHOS 110 03/25/2019 1654   BILITOT 0.4 03/25/2019 1654   GFRNONAA 54 (L) 03/25/2019 1654   GFRAA 62 03/25/2019 1654    Assessment: 1.  Screening for colorectal cancer: Patient is now [age] and it has been at least 10 years since her last colonoscopy 2.  Bloating: Consider IBS versus constipation versus GERD 3.  Atypical chest pain: Consider gastritis/GERD since pulmonology and cardiac work-up were negative 4.  Change in bowel habits: Patient has a decreased amount of bowel habits which sometimes feel constipated/incomplete  Plan: 1.  Scheduled patient for an EGD given bloating and atypical chest pain.  Also scheduled her for screening colonoscopy given that has  been over 10 years since her last one.  These [facility]  Discussed risks,benefits, limitations and alternatives and the patient agrees to proceed. 2.  Patient wishes not to start any medication because "meds do weird things to me", we will hold off for now, but pending findings of EGD and colon may benefit from PPI or other. 3.  Discussed taking a small amount of prune juice on a daily basis to stay more regular.  She could also try MiraLAX if she wishes. 4.  Patient to follow in clinic with Dr. Adela LankArmbruster per his recommendations.  Hyacinth MeekerJennifer Gelene Recktenwald, PA-C [facility] 04/25/2019, 10:22 AM  Cc: Shade FloodGreene, Jeffrey R, MD

## 2019-04-25 NOTE — Patient Instructions (Signed)
If you are age 62 or older, your body mass index should be between 23-30. Your Body mass index is 34.72 kg/m. If this is out of the aforementioned range listed, please consider follow up with your Primary Care Provider.  If you are age 61 or younger, your body mass index should be between 19-25. Your Body mass index is 34.72 kg/m. If this is out of the aformentioned range listed, please consider follow up with your Primary Care Provider.    You have been scheduled for an endoscopy and colonoscopy. Please follow the written instructions given to you at your visit today. Please pick up your prep supplies at the pharmacy within the next 1-3 days. If you use inhalers (even only as needed), please bring them with you on the day of your procedure.  We have sent the following medications to your pharmacy for you to pick up at your convenience: Suprep   Thank you for choosing me and [facility].  Misty Burke

## 2019-04-26 NOTE — Progress Notes (Signed)
Agree with assessment and plan as outlined.  

## 2019-04-29 ENCOUNTER — Ambulatory Visit: Payer: Medicare Other | Admitting: Gastroenterology

## 2019-05-02 ENCOUNTER — Encounter: Payer: Self-pay | Admitting: Gastroenterology

## 2019-05-02 ENCOUNTER — Telehealth: Payer: Self-pay | Admitting: Physician Assistant

## 2019-05-02 NOTE — Telephone Encounter (Signed)
Dr. Havery Moros,   We do not currently have any suprep samples to give to the patient and she cannot afford prep. Is it ok to change her prep to spilt dose Miralax instead?

## 2019-05-02 NOTE — Telephone Encounter (Signed)
Left patient voicemail message and new instructions sent via mychart.

## 2019-05-02 NOTE — Telephone Encounter (Signed)
Yes that's fine, thanks

## 2019-05-02 NOTE — Telephone Encounter (Signed)
Pt is scheduled for an EGD/col and stated that Suprep is over $100 and that she cannot afford the med.

## 2019-05-03 ENCOUNTER — Telehealth: Payer: Self-pay | Admitting: Family Medicine

## 2019-05-03 NOTE — Telephone Encounter (Signed)
Copied from [place] [id number]. Topic: General - Call Back - No Documentation >> May 02, 2019  3:48 PM Erick Blinks wrote: Best contact: [phone], pt requesting call back to discuss BMI questions. Please advise

## 2019-05-04 ENCOUNTER — Other Ambulatory Visit: Payer: Self-pay

## 2019-05-04 ENCOUNTER — Telehealth: Payer: Self-pay

## 2019-05-04 DIAGNOSIS — E669 Obesity, unspecified: Secondary | ICD-10-CM

## 2019-05-04 DIAGNOSIS — Z1159 Encounter for screening for other viral diseases: Secondary | ICD-10-CM

## 2019-05-04 NOTE — Telephone Encounter (Signed)
Called patient because she wanted to reschedule her Endo/ Colon for the next later appt. Will need to go over times for her to take her prep as well. Left message for her to call back, also explained that we would need to set her up for a covid test prior to her procedure.

## 2019-05-04 NOTE — Telephone Encounter (Signed)
Patient returned my call and was able to switch her appt to a later date with the same provider. Also went over new times for her prep and added her to the wait list for her COVID test, which she would like to have on 05/17/2019 at 10:00.

## 2019-05-04 NOTE — Telephone Encounter (Signed)
C/b to pt re questions.  States she wants to know if she has been tested for diabetes.  Advised that her blood sugar was recently tested and WNL as well as previous blood sugars in chart.  Confirmed for pt that her BMI is considered overweight and that this increases her risks of CVD, HTN, DM, etc.  Pt expresses interested in nutritionist/weight loss program.  Referral entered. No further questions.

## 2019-05-11 ENCOUNTER — Encounter: Payer: Medicare Other | Admitting: Gastroenterology

## 2019-05-16 ENCOUNTER — Encounter: Payer: Medicare Other | Admitting: Gastroenterology

## 2019-05-17 ENCOUNTER — Encounter: Payer: Self-pay | Admitting: Emergency Medicine

## 2019-05-19 ENCOUNTER — Encounter: Payer: Medicare Other | Admitting: Gastroenterology

## 2019-05-25 ENCOUNTER — Telehealth: Payer: Self-pay | Admitting: Family Medicine

## 2019-05-25 NOTE — Telephone Encounter (Signed)
Patient would like to sch to have blood work done . Patient has a possible pregnancy . Please advise

## 2019-05-30 ENCOUNTER — Encounter: Payer: Self-pay | Admitting: Gastroenterology

## 2019-05-30 ENCOUNTER — Ambulatory Visit (AMBULATORY_SURGERY_CENTER): Payer: Medicare Other | Admitting: Gastroenterology

## 2019-05-30 ENCOUNTER — Other Ambulatory Visit: Payer: Self-pay

## 2019-05-30 VITALS — BP 116/67 | HR 60 | Temp 97.6°F | Resp 17 | Ht 70.0 in | Wt 242.0 lb

## 2019-05-30 DIAGNOSIS — K2950 Unspecified chronic gastritis without bleeding: Secondary | ICD-10-CM | POA: Diagnosis not present

## 2019-05-30 DIAGNOSIS — R140 Abdominal distension (gaseous): Secondary | ICD-10-CM

## 2019-05-30 DIAGNOSIS — Z1211 Encounter for screening for malignant neoplasm of colon: Secondary | ICD-10-CM | POA: Diagnosis not present

## 2019-05-30 DIAGNOSIS — Z538 Procedure and treatment not carried out for other reasons: Secondary | ICD-10-CM | POA: Diagnosis not present

## 2019-05-30 DIAGNOSIS — K2281 Esophageal polyp: Secondary | ICD-10-CM

## 2019-05-30 DIAGNOSIS — K228 Other specified diseases of esophagus: Secondary | ICD-10-CM

## 2019-05-30 DIAGNOSIS — R0789 Other chest pain: Secondary | ICD-10-CM

## 2019-05-30 DIAGNOSIS — Z1159 Encounter for screening for other viral diseases: Secondary | ICD-10-CM

## 2019-05-30 MED ORDER — FAMOTIDINE 20 MG PO TABS: 20.0000 mg | ORAL_TABLET | Freq: Two times a day (BID) | ORAL | 2 refills | Status: DC

## 2019-05-30 MED ORDER — SODIUM CHLORIDE 0.9 % IV SOLN: 500.0000 mL | Freq: Once | INTRAVENOUS | Status: DC

## 2019-05-30 NOTE — Patient Instructions (Signed)
Await pathology results.  Use Pepcid (Famantodine) 20 mg twice a day.  Reschedule colonoscopy.  YOU HAD AN ENDOSCOPIC PROCEDURE TODAY AT [facility]:   Refer to the procedure report that was given to you for any specific questions about what was found during the examination.  If the procedure report does not answer your questions, please call your gastroenterologist to clarify.  If you requested that your care partner not be given the details of your procedure findings, then the procedure report has been included in a sealed envelope for you to review at your convenience later.  YOU SHOULD EXPECT: Some feelings of bloating in the abdomen. Passage of more gas than usual.  Walking can help get rid of the air that was put into your GI tract during the procedure and reduce the bloating. If you had a lower endoscopy (such as a colonoscopy or flexible sigmoidoscopy) you may notice spotting of blood in your stool or on the toilet paper. If you underwent a bowel prep for your procedure, you may not have a normal bowel movement for a few days.  Please Note:  You might notice some irritation and congestion in your nose or some drainage.  This is from the oxygen used during your procedure.  There is no need for concern and it should clear up in a day or so.  SYMPTOMS TO REPORT IMMEDIATELY:   Following lower endoscopy (colonoscopy or flexible sigmoidoscopy):  Excessive amounts of blood in the stool  Significant tenderness or worsening of abdominal pains  Swelling of the abdomen that is new, acute  Fever of 100F or higher   Following upper endoscopy (EGD)  Vomiting of blood or coffee ground material  New chest pain or pain under the shoulder blades  Painful or persistently difficult swallowing  New shortness of breath  Fever of 100F or higher  Black, tarry-looking stools  For urgent or emergent issues, a gastroenterologist can be reached at any hour by calling [phone]  [phone].   DIET:  We do recommend a small meal at first, but then you may proceed to your regular diet.  Drink plenty of fluids but you should avoid alcoholic beverages for 24 hours.  ACTIVITY:  You should plan to take it easy for the rest of today and you should NOT DRIVE or use heavy machinery until tomorrow (because of the sedation medicines used during the test).    FOLLOW UP: Our staff will call the number listed on your records 48-72 hours following your procedure to check on you and address any questions or concerns that you may have regarding the information given to you following your procedure. If we do not reach you, we will leave a message.  We will attempt to reach you two times.  During this call, we will ask if you have developed any symptoms of COVID 19. If you develop any symptoms (ie: fever, flu-like symptoms, shortness of breath, cough etc.) before then, please call [phone].  If you test positive for Covid 19 in the 2 weeks post procedure, please call and report this information to [place].    If any biopsies were taken you will be contacted by phone or by letter within the next 1-3 weeks.  Please call us at [phone] if you have not heard about the biopsies in 3 weeks.    SIGNATURES/CONFIDENTIALITY: You and/or your care partner have signed paperwork which will be entered into your electronic medical record.  These signatures attest to the  fact that that the information above on your After Visit Summary has been reviewed and is understood.  Full responsibility of the confidentiality of this discharge information lies with you and/or your care-partner.

## 2019-05-30 NOTE — Progress Notes (Signed)
Called to room to assist during endoscopic procedure.  Patient ID and intended procedure confirmed with present staff. Received instructions for my participation in the procedure from the performing physician.  

## 2019-05-30 NOTE — Telephone Encounter (Signed)
Please schedule patient an appt for possible pregnancy and lab work

## 2019-05-30 NOTE — Progress Notes (Signed)
A/ox3, pleased with MAC, report to RN 

## 2019-05-30 NOTE — Progress Notes (Signed)
[facility]- [place]  Pt states, "I feel like I may be pregnant."  I asked her what made her feel that way- she states "I have pulsating in my throat." No other issues stated.  She states that she has not had a menstrual cycle in 5 years.  She took a home pregnancy test in September that was negative and has had no sexual encounters since then. Dr. Havery Moros at bedside to talk to pt.  Pt is in agreement to proceed without needing a pregnancy test.   Pt states that she ate solid food on 05-29-19- fried chicken, cheezits, and muffins.  She didn't follow Miralax instructions as instructed- she at first says stools are "clear" but then says they are "cloudy brown" but denies solid stool.  Dr. Havery Moros is aware and is ok to proceed.

## 2019-05-30 NOTE — Op Note (Signed)
[facility] Patient Name: Misty Burke Procedure Date: 05/30/2019 2:06 PM MRN: [id number] Endoscopist: Remo Lipps P. Havery Moros , MD Age: 63 Referring MD:  Date of Birth: January 23, 1956 Gender: Female Account #: [id number] Procedure:                Upper GI endoscopy Indications:              Abdominal bloating, Chest pain (non cardiac) Medicines:                Monitored Anesthesia Care Procedure:                Pre-Anesthesia Assessment:                           - Prior to the procedure, a History and Physical                            was performed, and patient medications and                            allergies were reviewed. The patient's tolerance of                            previous anesthesia was also reviewed. The risks                            and benefits of the procedure and the sedation                            options and risks were discussed with the patient.                            All questions were answered, and informed consent                            was obtained. Prior Anticoagulants: The patient has                            taken no previous anticoagulant or antiplatelet                            agents. ASA Grade Assessment: II - A patient with                            mild systemic disease. After reviewing the risks                            and benefits, the patient was deemed in                            satisfactory condition to undergo the procedure.                           After obtaining informed consent, the endoscope was  passed under direct vision. Throughout the                            procedure, the patient's blood pressure, pulse, and                            oxygen saturations were monitored continuously. The                            Endoscope was introduced through the mouth, and                            advanced to the second part of duodenum. The upper                            GI  endoscopy was accomplished without difficulty.                            The patient tolerated the procedure well. Scope In: Scope Out: Findings:                 Esophagogastric landmarks were identified: the                            Z-line was found at 36 cm, the gastroesophageal                            junction was found at 36 cm and the upper extent of                            the gastric folds was found at 38 cm from the                            incisors.                           A 2 cm hiatal hernia was present.                           A single 3 mm flat polyp was found 35 cm from the                            incisors. The polyp was removed with a cold biopsy                            forceps. Resection and retrieval were complete.                           The exam of the esophagus was otherwise normal. No                            inflammatory changes noted.  Localized mild inflammation characterized by                            erythema and friability was found in the gastric                            antrum, without focal ulceration or erosion.                           The exam of the stomach was otherwise normal.                           Biopsies were taken with a cold forceps in the                            gastric body, at the incisura and in the gastric                            antrum for Helicobacter pylori testing.                           Diffuse mildly erythematous mucosa was found in the                            duodenal bulb without focal ulceration.                           The exam of the duodenum was otherwise normal.                           Biopsies for histology were taken with a cold                            forceps in the duodenal bulb and in the second                            portion of the duodenum. Complications:            No immediate complications. Estimated blood loss:                             Minimal. Estimated Blood Loss:     Estimated blood loss was minimal. Impression:               - Esophagogastric landmarks identified.                           - 2 cm hiatal hernia.                           - Flat benign appearing small esophageal polypoid                            lesion. Resected and retrieved.                           -  Gastritis.                           - Erythematous duodenopathy.                           - Biopsies were taken with a cold forceps for                            Helicobacter pylori testing.                           - Biopsies were taken with a cold forceps for                            evaluation of celiac disease. Recommendation:           - Patient has a contact number available for                            emergencies. The signs and symptoms of potential                            delayed complications were discussed with the                            patient. Return to normal activities tomorrow.                            Written discharge instructions were provided to the                            patient.                           - Resume previous diet.                           - Continue present medications.                           - Consideration for trial of omeprazole 20 / day or                            pepcid  twice daily (offered but declined in                            the past).                           - Await pathology results. Minimize NSAID use Misty Spare P. Contrell Ballentine, MD 05/30/2019 3:13:12 PM This report has been signed electronically.

## 2019-05-30 NOTE — Op Note (Signed)
[facility] Patient Name: Misty Burke Procedure Date: 05/30/2019 2:06 PM MRN: [id number] Endoscopist: Remo Lipps P. Havery Moros , MD Age: 63 Referring MD:  Date of Birth: 1955-12-25 Gender: Female Account #: [id number] Procedure:                Colonoscopy Indications:              Screening for colorectal malignant neoplasm Medicines:                Monitored Anesthesia Care Procedure:                Pre-Anesthesia Assessment:                           - Prior to the procedure, a History and Physical                            was performed, and patient medications and                            allergies were reviewed. The patient's tolerance of                            previous anesthesia was also reviewed. The risks                            and benefits of the procedure and the sedation                            options and risks were discussed with the patient.                            All questions were answered, and informed consent                            was obtained. Prior Anticoagulants: The patient has                            taken no previous anticoagulant or antiplatelet                            agents. ASA Grade Assessment: II - A patient with                            mild systemic disease. After reviewing the risks                            and benefits, the patient was deemed in                            satisfactory condition to undergo the procedure.                           After obtaining informed consent, the colonoscope  was passed under direct vision. Throughout the                            procedure, the patient's blood pressure, pulse, and                            oxygen saturations were monitored continuously. The                            Colonoscope was introduced through the anus with                            the intention of advancing to the cecum. The scope                            was advanced  to the sigmoid colon before the                            procedure was aborted. Medications were given. The                            colonoscopy was performed without difficulty. The                            patient tolerated the procedure well. The quality                            of the bowel preparation was poor. No anatomical                            landmarks were photographed. Scope In: 3:01:23 PM Scope Out: 3:01:59 PM Total Procedure Duration: 0 hours 0 minutes 36 seconds  Findings:                 The perianal and digital rectal examinations were                            normal.                           A large amount of semi-solid stool was found in the                            rectum and in the recto-sigmoid colon, precluding                            visualization. The procedure was aborted, could not                            safely clear the colon nor achieve cecal intubation                            safely due to prep. Complications:            No immediate  complications. Estimated blood loss:                            None. Estimated Blood Loss:     Estimated blood loss: none. Impression:               - Preparation of the colon was poor.                           - Stool in the rectum and in the recto-sigmoid                            colon.                           - No specimens collected. Recommendation:           - Patient has a contact number available for                            emergencies. The signs and symptoms of potential                            delayed complications were discussed with the                            patient. Return to normal activities tomorrow.                            Written discharge instructions were provided to the                            patient.                           - Resume previous diet.                           - Continue present medications.                           - Repeat colonoscopy  because the bowel preparation                            was suboptimal. It does not appear the patient                            completed the initial preparation as recommended                            and ate solid food yesterday. Reschedule                            colonoscopy at the patient's convenience. Viviann SpareSteven P. Rivaan Kendall, MD 05/30/2019 3:07:06 PM This report has been signed electronically.

## 2019-06-01 ENCOUNTER — Encounter: Payer: Self-pay | Admitting: Gastroenterology

## 2019-06-01 ENCOUNTER — Telehealth: Payer: Self-pay

## 2019-06-01 ENCOUNTER — Other Ambulatory Visit: Payer: Self-pay

## 2019-06-01 ENCOUNTER — Ambulatory Visit (AMBULATORY_SURGERY_CENTER): Payer: Medicare Other | Admitting: *Deleted

## 2019-06-01 VITALS — Temp 96.6°F | Ht 70.0 in | Wt 246.0 lb

## 2019-06-01 DIAGNOSIS — Z1159 Encounter for screening for other viral diseases: Secondary | ICD-10-CM

## 2019-06-01 DIAGNOSIS — Z1211 Encounter for screening for malignant neoplasm of colon: Secondary | ICD-10-CM

## 2019-06-01 MED ORDER — PLENVU 140 G PO SOLR: 1.0000 | ORAL | 0 refills | Status: DC

## 2019-06-01 NOTE — Progress Notes (Signed)
No egg or soy allergy known to patient  No issues with past sedation with any surgeries  or procedures, no past  intubation No diet pills per patient No home 02 use per patient  No blood thinners per patient  Pt denies issues with constipation  No A fib or A flutter  EMMI video sent to pt's e mail   Due to the COVID-19 pandemic we are asking patients to follow these guidelines. Please only bring one care partner. Please be aware that your care partner may wait in the car in the parking lot or if they feel like they will be too hot to wait in the car, they may wait in the lobby on the 4th floor. All care partners are required to wear a mask the entire time (we do not have any that we can provide them), they need to practice social distancing, and we will do a Covid check for all patient's and care partners when you arrive. Also we will check their temperature and your temperature. If the care partner waits in their car they need to stay in the parking lot the entire time and we will call them on their cell phone when the patient is ready for discharge so they can bring the car to the front of the building. Also all patient's will need to wear a mask into building.  covid test Monday 11-9 at 1040 am

## 2019-06-01 NOTE — Telephone Encounter (Signed)
  Follow up Call-  Call back number 05/30/2019  Post procedure Call Back phone  # [phone]  Permission to leave phone message Yes  Some recent data might be hidden     Patient questions:  Do you have a fever, pain , or abdominal swelling? No. Pain Score  0 *  Have you tolerated food without any problems? Yes.    Have you been able to return to your normal activities? Yes.    Do you have any questions about your discharge instructions: Diet   No. Medications  No. Follow up visit  No.  Do you have questions or concerns about your Care? No.  Actions: * If pain score is 4 or above: No action needed, pain <4.  Complain of mild sore throat. Instructed to call back if symptoms worsen. Also gargle with salt and water.  1. Have you developed a fever since your procedure? no  2.   Have you had an respiratory symptoms (SOB or cough) since your procedure? no  3.   Have you tested positive for COVID 19 since your procedure no  4.   Have you had any family members/close contacts diagnosed with the COVID 19 since your procedure?  no   If yes to any of these questions please route to Joylene John, RN and Alphonsa Gin, Therapist, sports.

## 2019-06-02 NOTE — Telephone Encounter (Signed)
Spoke with pt and she has confirmed with another doctor that she is not pregnant. She declined an appt and withdrew her request for labs

## 2019-06-07 ENCOUNTER — Other Ambulatory Visit: Payer: Self-pay | Admitting: Gastroenterology

## 2019-06-07 DIAGNOSIS — R140 Abdominal distension (gaseous): Secondary | ICD-10-CM

## 2019-06-07 DIAGNOSIS — R0789 Other chest pain: Secondary | ICD-10-CM

## 2019-06-07 LAB — SARS CORONAVIRUS 2 (TAT 6-24 HRS): SARS Coronavirus 2: NEGATIVE

## 2019-06-08 ENCOUNTER — Other Ambulatory Visit: Payer: Self-pay

## 2019-06-08 ENCOUNTER — Telehealth: Payer: Self-pay

## 2019-06-08 MED ORDER — FLUCONAZOLE 200 MG PO TABS: 400.0000 mg | ORAL_TABLET | Freq: Every day | ORAL | 0 refills | Status: DC

## 2019-06-08 NOTE — Telephone Encounter (Signed)
Left message for patient to please call back. 

## 2019-06-09 ENCOUNTER — Other Ambulatory Visit: Payer: Self-pay

## 2019-06-09 ENCOUNTER — Encounter: Payer: Self-pay | Admitting: Gastroenterology

## 2019-06-09 ENCOUNTER — Ambulatory Visit (AMBULATORY_SURGERY_CENTER): Payer: Medicare Other | Admitting: Gastroenterology

## 2019-06-09 VITALS — BP 104/69 | HR 64 | Temp 98.8°F | Resp 12 | Ht 70.0 in | Wt 246.0 lb

## 2019-06-09 DIAGNOSIS — Z1211 Encounter for screening for malignant neoplasm of colon: Secondary | ICD-10-CM | POA: Diagnosis not present

## 2019-06-09 MED ORDER — SODIUM CHLORIDE 0.9 % IV SOLN: 500.0000 mL | Freq: Once | INTRAVENOUS | Status: DC

## 2019-06-09 NOTE — Patient Instructions (Signed)
HANDOUTS PROVIDED ON: DIVERTICULOSIS  YOU MAY RESUME YOUR PREVIOUS DIET AND MEDICATION SCHEDULE.  Carey YOU FOR ALLOWING [place] TO CARE FOR YOU TODAY!!!  YOU HAD AN ENDOSCOPIC PROCEDURE TODAY AT [facility]:   Refer to the procedure report that was given to you for any specific questions about what was found during the examination.  If the procedure report does not answer your questions, please call your gastroenterologist to clarify.  If you requested that your care partner not be given the details of your procedure findings, then the procedure report has been included in a sealed envelope for you to review at your convenience later.  YOU SHOULD EXPECT: Some feelings of bloating in the abdomen. Passage of more gas than usual.  Walking can help get rid of the air that was put into your GI tract during the procedure and reduce the bloating. If you had a lower endoscopy (such as a colonoscopy or flexible sigmoidoscopy) you may notice spotting of blood in your stool or on the toilet paper. If you underwent a bowel prep for your procedure, you may not have a normal bowel movement for a few days.  Please Note:  You might notice some irritation and congestion in your nose or some drainage.  This is from the oxygen used during your procedure.  There is no need for concern and it should clear up in a day or so.  SYMPTOMS TO REPORT IMMEDIATELY:   Following lower endoscopy (colonoscopy or flexible sigmoidoscopy):  Excessive amounts of blood in the stool  Significant tenderness or worsening of abdominal pains  Swelling of the abdomen that is new, acute  Fever of 100F or higher  For urgent or emergent issues, a gastroenterologist can be reached at any hour by calling [phone].   DIET:  We do recommend a small meal at first, but then you may proceed to your regular diet.  Drink plenty of fluids but you should avoid alcoholic beverages for 24 hours.  ACTIVITY:  You should plan to take  it easy for the rest of today and you should NOT DRIVE or use heavy machinery until tomorrow (because of the sedation medicines used during the test).    FOLLOW UP: Our staff will call the number listed on your records 48-72 hours following your procedure to check on you and address any questions or concerns that you may have regarding the information given to you following your procedure. If we do not reach you, we will leave a message.  We will attempt to reach you two times.  During this call, we will ask if you have developed any symptoms of COVID 19. If you develop any symptoms (ie: fever, flu-like symptoms, shortness of breath, cough etc.) before then, please call [phone].  If you test positive for Covid 19 in the 2 weeks post procedure, please call and report this information to [place].    If any biopsies were taken you will be contacted by phone or by letter within the next 1-3 weeks.  Please call us at [phone] if you have not heard about the biopsies in 3 weeks.    SIGNATURES/CONFIDENTIALITY: You and/or your care partner have signed paperwork which will be entered into your electronic medical record.  These signatures attest to the fact that that the information above on your After Visit Summary has been reviewed and is understood.  Full responsibility of the confidentiality of this discharge information lies with you and/or your care-partner.

## 2019-06-09 NOTE — Op Note (Signed)
[facility] Patient Name: Misty AvenaRita Shark Procedure Date: 06/09/2019 2:46 PM MRN: [id number] Endoscopist: Viviann SpareSteven P. Adela LankArmbruster , MD Age: [age] Referring MD:  Date of Birth: 01/05/1956 Gender: Female Account #: [id number] Procedure:                Colonoscopy Indications:              Screening for colorectal malignant neoplasm Medicines:                Monitored Anesthesia Care Procedure:                Pre-Anesthesia Assessment:                           - Prior to the procedure, a History and Physical                            was performed, and patient medications and                            allergies were reviewed. The patient's tolerance of                            previous anesthesia was also reviewed. The risks                            and benefits of the procedure and the sedation                            options and risks were discussed with the patient.                            All questions were answered, and informed consent                            was obtained. Prior Anticoagulants: The patient has                            taken no previous anticoagulant or antiplatelet                            agents. ASA Grade Assessment: II - A patient with                            mild systemic disease. After reviewing the risks                            and benefits, the patient was deemed in                            satisfactory condition to undergo the procedure.                           After obtaining informed consent, the colonoscope  was passed under direct vision. Throughout the                            procedure, the patient's blood pressure, pulse, and                            oxygen saturations were monitored continuously. The                            Colonoscope was introduced through the anus and                            advanced to the the cecum, identified by                            appendiceal orifice and  ileocecal valve. The                            colonoscopy was performed without difficulty. The                            patient tolerated the procedure well. The quality                            of the bowel preparation was good. The ileocecal                            valve, appendiceal orifice, and rectum were                            photographed. Scope In: 2:50:15 PM Scope Out: 3:16:38 PM Scope Withdrawal Time: 0 hours 21 minutes 50 seconds  Total Procedure Duration: 0 hours 26 minutes 23 seconds  Findings:                 The perianal and digital rectal examinations were                            normal.                           Scattered small-mouthed diverticula were found in                            the entire colon.                           Anal papilla(e) were hypertrophied.                           The colon was spastic, but otherwise the exam was                            without abnormality. Complications:            No immediate complications. Estimated blood loss:  None. Estimated Blood Loss:     Estimated blood loss: none. Impression:               - Diverticulosis in the entire examined colon.                           - Anal papilla(e) were hypertrophied.                           - Spastic colon                           - The examination was otherwise normal.                           - No specimens collected. Recommendation:           - Patient has a contact number available for                            emergencies. The signs and symptoms of potential                            delayed complications were discussed with the                            patient. Return to normal activities tomorrow.                            Written discharge instructions were provided to the                            patient.                           - Resume previous diet.                           - Continue present medications.                            - Repeat colonoscopy in 10 years for screening                            purposes. Remo Lipps P. Armbruster, MD 06/09/2019 3:20:23 PM This report has been signed electronically.

## 2019-06-09 NOTE — Progress Notes (Signed)
Report given to PACU, vss 

## 2019-06-13 ENCOUNTER — Telehealth: Payer: Self-pay | Admitting: *Deleted

## 2019-06-13 NOTE — Telephone Encounter (Signed)
  Follow up Call-  Call back number 06/09/2019 05/30/2019  Post procedure Call Back phone  # [phone]  Permission to leave phone message Yes Yes  Some recent data might be hidden     Patient questions:  Do you have a fever, pain , or abdominal swelling? No. Pain Score  0 *  Have you tolerated food without any problems? Yes.    Have you been able to return to your normal activities? Yes.    Do you have any questions about your discharge instructions: Diet   No. Medications  No. Follow up visit  Yes.    Do you have questions or concerns about your Care? Yes.  pt had lots of questions regarding results from her EGD on 11/11. Discussed findings and reasons for pepcid and fluconazole. Pt verbalized understanding. She c/o of some sinus congestion but has no fever.  Actions: * If pain score is 4 or above: No action needed, pain <4.  1. Have you developed a fever since your procedure? no  2.   Have you had an respiratory symptoms (SOB or cough) since your procedure? Yes Pt has had a sinus headache and nasal congestion since before her procedure, she states this is better today.  3.   Have you tested positive for COVID 19 since your procedure no  4.   Have you had any family members/close contacts diagnosed with the COVID 19 since your procedure?  no   If yes to any of these questions please route to Joylene John, RN and Alphonsa Gin, Therapist, sports.

## 2019-06-17 ENCOUNTER — Telehealth: Payer: Self-pay | Admitting: Gastroenterology

## 2019-06-17 NOTE — Telephone Encounter (Signed)
Called patient back and she had stopped her fluconazole given after her EGD because she thought it was dehydrating her. She has a cough and feels very tired, said she is trying to drink a lot of water and gator-aid, but still has dry lips and her urine is dark and smells. I encouraged her to push fluids even more, start back on and finish her fluconazole, and call her PCP about her cough and tiredness. She agreed

## 2019-06-28 ENCOUNTER — Other Ambulatory Visit: Payer: Self-pay

## 2019-06-28 DIAGNOSIS — Z20822 Contact with and (suspected) exposure to covid-19: Secondary | ICD-10-CM

## 2019-06-29 ENCOUNTER — Telehealth: Payer: Medicare Other | Admitting: Family Medicine

## 2019-06-30 LAB — NOVEL CORONAVIRUS, NAA: SARS-CoV-2, NAA: NOT DETECTED

## 2019-07-18 ENCOUNTER — Other Ambulatory Visit: Payer: Self-pay | Admitting: Family Medicine

## 2019-09-01 NOTE — Progress Notes (Signed)
[facility] - Clinic Note  09/07/2019     CHIEF COMPLAINT Patient presents for Retina Follow Up   HISTORY OF PRESENT ILLNESS: Misty Burke is a 64 y.o. female who presents to the clinic today for:  Flashes/floaters OD  HPI    Retina Follow Up    In right eye.  This started 9 months ago.  Since onset it is gradually worsening.  I, the attending physician,  performed the HPI with the patient and updated documentation appropriately.          Comments    F/U Cystic retina tuft OD. Patient states she has occasional "flutter" ou and occasionally she notices a shadow in peripheral vision OS.Denies ocular pain       Last edited by Rennis Chris, MD on 09/11/2019  1:29 AM. (History)    pt states she states she still has the same floater she had last year, she states it doesn't float anymore, it just sits there "like a big ball of hair", she states she has not been using AT's like she should  Referring physician: Shade Flood, MD [address] [place],  [place] [address]  HISTORICAL INFORMATION:   Selected notes from the MEDICAL RECORD NUMBER    CURRENT MEDICATIONS: No current outpatient medications on file. (Ophthalmic Drugs)   No current facility-administered medications for this visit. (Ophthalmic Drugs)   Current Outpatient Medications (Other)  Medication Sig  . Ascorbic Acid (VITAMIN C) 1000 MG tablet Take 1,000 mg by mouth daily.  Marland Kitchen aspirin EC 81 MG tablet Take 81 mg by mouth daily.  . B Complex Vitamins (B COMPLEX 1 PO) Take by mouth.  . cholecalciferol (VITAMIN D3) 25 MCG (1000 UT) tablet Take 1,000 Units by mouth daily.  . famotidine (PEPCID) 20 MG tablet TAKE 1 TABLET BY MOUTH TWICE A DAY  . fexofenadine (ALLEGRA) 180 MG tablet Take 180 mg by mouth as needed.   . fluconazole (DIFLUCAN) 200 MG tablet Take 2 tablets (400 mg total) by mouth daily. Take 2 tabs first day;  then take 1 tab for 13 days  . fluticasone (FLONASE) 50 MCG/ACT nasal spray  PLACE 1 TO 2 SPRAYS INTO BOTH NOSTRILS AT BEDTIME  . ipratropium (ATROVENT) 0.06 % nasal spray PLACE 1-2 SPRAYS INTO BOTH NOSTRILS 4 (FOUR) TIMES DAILY AS NEEDED FOR RHINITIS.  Marland Kitchen Multiple Vitamin (MULTIVITAMIN) tablet Take 1 tablet by mouth daily.  Marland Kitchen omeprazole (PRILOSEC) 20 MG capsule TAKE 1 CAPSULE BY MOUTH EVERY DAY   Current Facility-Administered Medications (Other)  Medication Route  . 0.9 %  sodium chloride infusion Intravenous      REVIEW OF SYSTEMS: ROS    Positive for: Eyes   Negative for: Constitutional, Gastrointestinal, Neurological, Skin, Genitourinary, Musculoskeletal, HENT, Endocrine, Cardiovascular, Respiratory, Psychiatric, Allergic/Imm, Heme/Lymph   Last edited by Eldridge Scot, LPN on 8/34/1962  1:09 PM. (History)       ALLERGIES No Known Allergies  PAST MEDICAL HISTORY Past Medical History:  Diagnosis Date  . Allergy   . Angio-edema   . Anxiety   . Depression    situational after death of her mother   . GERD (gastroesophageal reflux disease)   . Sleep apnea    no CPAP  . Urticaria    Past Surgical History:  Procedure Laterality Date  . BREAST CYST ASPIRATION Left   . COLONOSCOPY    . UPPER GASTROINTESTINAL ENDOSCOPY      FAMILY HISTORY Family History  Problem Relation Age of Onset  .  Cancer Other   . Cancer Father   . Diabetes Neg Hx   . Hypertension Neg Hx   . Breast cancer Neg Hx   . Colon cancer Neg Hx   . Esophageal cancer Neg Hx   . Rectal cancer Neg Hx   . Stomach cancer Neg Hx   . Colon polyps Neg Hx     SOCIAL HISTORY Social History   Tobacco Use  . Smoking status: Never Smoker  . Smokeless tobacco: Never Used  Substance Use Topics  . Alcohol use: Yes    Comment: socially   . Drug use: No         OPHTHALMIC EXAM:  Base Eye Exam    Visual Acuity (Snellen - Linear)      Right Left   Dist [place] 20/30 20/20   Dist ph [place] 20/20 -1        Tonometry (Tonopen, 1:16 PM)      Right Left   Pressure 10 10        Pupils      Dark Light Shape React APD   Right 3 2 Round Brisk None   Left 3 2 Round Brisk None       Visual Fields (Counting fingers)      Left Right    Full Full       Extraocular Movement      Right Left    Full, Ortho Full, Ortho       Neuro/Psych    Oriented x3: Yes   Mood/Affect: Normal       Dilation    Both eyes: 1.0% Mydriacyl, 2.5% Phenylephrine @ 1:17 PM        Slit Lamp and Fundus Exam    External Exam      Right Left   External Normal Normal       Slit Lamp Exam      Right Left   Lids/Lashes Normal; mild fat prolapse inf lid Normal   Conjunctiva/Sclera White and quiet White and quiet   Cornea Arcus, trace Punctate epithelial erosions, trace Debris in tear film Arcus, trace Punctate epithelial erosions   Anterior Chamber Deep and quiet Deep and quiet   Iris Round and moderately dilated to 5.5 Round and moderately dilated to 5.58mm   Lens 2+ NSC, 2+ CC 2+ NSC, 2+ CC   Vitreous Vitreous syneresis, Posterior vitreous detachment, Weiss ring, vitreous condensations syneresis       Fundus Exam      Right Left   Disc Pink and Sharp Pink and Sharp   C/D Ratio 0.3 0.3   Macula flat; good foveal reflex, mild Retinal pigment epithelial mottling, No heme or edema flat; good foveal reflex, mild Retinal pigment epithelial mottling, No heme or edema   Vessels Vascular attenuation mild Vascular attenuation, mild AV crossing changes   Periphery attached; VR tufts with opercula at 0600 and 1100 equator -- great laser surrounding both lesions attached          IMAGING AND PROCEDURES  Imaging and Procedures for 07/31/18  OCT, Retina - OU - Both Eyes       Right Eye Quality was good. Central Foveal Thickness: 257. Progression has been stable. Findings include normal foveal contour, no IRF, no SRF.   Left Eye Quality was good. Central Foveal Thickness: 245. Progression has been stable. Findings include normal foveal contour, no IRF, no SRF (Partial PVD, inferior  inner-retinoschisis caught on widefield - stable).   Notes *Images captured and  stored on drive  Diagnosis / Impression:  NFP; no IRF/SRF OU OS -- inner retinal retinoschisis, inferior periphery -- caught on widefield OCT -- stable  Clinical management:  See below  Abbreviations: NFP - Normal foveal profile. CME - cystoid macular edema. PED - pigment epithelial detachment. IRF - intraretinal fluid. SRF - subretinal fluid. EZ - ellipsoid zone. ERM - epiretinal membrane. ORA - outer retinal atrophy. ORT - outer retinal tubulation. SRHM - subretinal hyper-reflective material                 ASSESSMENT/PLAN:    ICD-10-CM   1. Cystic retinal tuft of right eye  H35.461   2. Retinal break, right  H33.301   3. Posterior vitreous detachment of right eye  H43.811   4. Combined forms of age-related cataract of both eyes  H25.813   5. Retinal edema  H35.81 OCT, Retina - OU - Both Eyes    1,2. VR tufts w/ retinal breaks OD  - Cystic retinal tufts at 1100 and 0600 equator   - S/p laser retinopexy OD (01.06.20) -- great laser surrounding both areas  - no new lesions, RT/RD  - F/u 1 year  3. PVD / vitreous syneresis OU  - OD symptomatic with flashes/floaters x2 wks -- was seen by Dr. Annamaria Boots on call prior to Xmas 2019  - Discussed findings and prognosis  - No other RT or RD on 360 scleral depressed exam  - Reviewed s/s of RT/RD  - Strict return precautions for any such RT/RD signs/symptoms  4. Combined cataract OU  - The symptoms of cataract, surgical options, and treatments and risks were discussed with patient.  - discussed diagnosis and progression  - not yet visually significant  - monitor for now  5. No retinal edema on exam or OCT   Ophthalmic Meds Ordered this visit:  No orders of the defined types were placed in this encounter.      Return in about 1 year (around 09/06/2020) for f/u VR tufts OD, DFE, OCT.  There are no Patient Instructions on file for this  visit.   Explained the diagnoses, plan, and follow up with the patient and they expressed understanding.  Patient expressed understanding of the importance of proper follow up care.   This document serves as a record of services personally performed by Gardiner Sleeper, MD, PhD. It was created on their behalf by Roselee Nova, COMT. The creation of this record is the provider's dictation and/or activities during the visit.  Electronically signed by: Roselee Nova, COMT 09/11/19 1:49 AM   This document serves as a record of services personally performed by Gardiner Sleeper, MD, PhD. It was created on their behalf by Ernest Mallick, OA, an ophthalmic assistant. The creation of this record is the provider's dictation and/or activities during the visit.    Electronically signed by: Ernest Mallick, OA 02.10.2021 1:49 AM  Gardiner Sleeper, M.D., Ph.D. Diseases & Surgery of the Retina and [place] 09/07/2019   I have reviewed the above documentation for accuracy and completeness, and I agree with the above. Gardiner Sleeper, M.D., Ph.D. 09/11/19 1:49 AM   Abbreviations: M myopia (nearsighted); A astigmatism; H hyperopia (farsighted); P presbyopia; Mrx spectacle prescription;  CTL contact lenses; OD right eye; OS left eye; OU both eyes  XT exotropia; ET esotropia; PEK punctate epithelial keratitis; PEE punctate epithelial erosions; DES dry eye syndrome; MGD meibomian gland dysfunction; ATs artificial tears; PFAT's preservative  free artificial tears; NSC nuclear sclerotic cataract; PSC posterior subcapsular cataract; ERM epi-retinal membrane; PVD posterior vitreous detachment; RD retinal detachment; DM diabetes mellitus; DR diabetic retinopathy; NPDR non-proliferative diabetic retinopathy; PDR proliferative diabetic retinopathy; CSME clinically significant macular edema; DME diabetic macular edema; dbh dot blot hemorrhages; CWS cotton wool spot; POAG primary open angle glaucoma;  C/D cup-to-disc ratio; HVF humphrey visual field; GVF goldmann visual field; OCT optical coherence tomography; IOP intraocular pressure; BRVO Branch retinal vein occlusion; CRVO central retinal vein occlusion; CRAO central retinal artery occlusion; BRAO branch retinal artery occlusion; RT retinal tear; SB scleral buckle; PPV pars plana vitrectomy; VH Vitreous hemorrhage; PRP panretinal laser photocoagulation; IVK intravitreal kenalog; VMT vitreomacular traction; MH Macular hole;  NVD neovascularization of the disc; NVE neovascularization elsewhere; AREDS age related eye disease study; ARMD age related macular degeneration; POAG primary open angle glaucoma; EBMD epithelial/anterior basement membrane dystrophy; ACIOL anterior chamber intraocular lens; IOL intraocular lens; PCIOL posterior chamber intraocular lens; Phaco/IOL phacoemulsification with intraocular lens placement; PRK photorefractive keratectomy; LASIK laser assisted in situ keratomileusis; HTN hypertension; DM diabetes mellitus; COPD chronic obstructive pulmonary disease

## 2019-09-07 ENCOUNTER — Ambulatory Visit (INDEPENDENT_AMBULATORY_CARE_PROVIDER_SITE_OTHER): Payer: Medicare Other | Admitting: Ophthalmology

## 2019-09-07 DIAGNOSIS — H35461 Secondary vitreoretinal degeneration, right eye: Secondary | ICD-10-CM | POA: Diagnosis not present

## 2019-09-07 DIAGNOSIS — H33301 Unspecified retinal break, right eye: Secondary | ICD-10-CM | POA: Diagnosis not present

## 2019-09-07 DIAGNOSIS — H43811 Vitreous degeneration, right eye: Secondary | ICD-10-CM

## 2019-09-07 DIAGNOSIS — H25813 Combined forms of age-related cataract, bilateral: Secondary | ICD-10-CM

## 2019-09-07 DIAGNOSIS — H3581 Retinal edema: Secondary | ICD-10-CM | POA: Diagnosis not present

## 2019-09-11 ENCOUNTER — Encounter (INDEPENDENT_AMBULATORY_CARE_PROVIDER_SITE_OTHER): Payer: Self-pay | Admitting: Ophthalmology

## 2020-04-05 ENCOUNTER — Telehealth: Payer: Medicare Other | Admitting: Family Medicine

## 2020-04-11 ENCOUNTER — Telehealth: Payer: Self-pay | Admitting: Family Medicine

## 2020-04-11 NOTE — Telephone Encounter (Signed)
Patient is asking for a referral to ENT  Pt is dizzy , headaches, runny nose. Pt declined virtual visit

## 2020-04-18 ENCOUNTER — Encounter: Payer: Self-pay | Admitting: Family Medicine

## 2020-04-18 ENCOUNTER — Encounter: Payer: Self-pay | Admitting: *Deleted

## 2020-04-19 ENCOUNTER — Other Ambulatory Visit: Payer: Self-pay

## 2020-04-19 ENCOUNTER — Telehealth: Payer: Self-pay | Admitting: Family Medicine

## 2020-04-19 ENCOUNTER — Ambulatory Visit: Payer: Medicare Other | Admitting: Family Medicine

## 2020-04-19 ENCOUNTER — Encounter: Payer: Self-pay | Admitting: Family Medicine

## 2020-04-19 VITALS — BP 124/77 | HR 78 | Temp 98.7°F | Resp 15 | Ht 70.0 in | Wt 247.0 lb

## 2020-04-19 DIAGNOSIS — H539 Unspecified visual disturbance: Secondary | ICD-10-CM

## 2020-04-19 DIAGNOSIS — R0981 Nasal congestion: Secondary | ICD-10-CM

## 2020-04-19 DIAGNOSIS — R519 Headache, unspecified: Secondary | ICD-10-CM

## 2020-04-19 DIAGNOSIS — E663 Overweight: Secondary | ICD-10-CM

## 2020-04-19 DIAGNOSIS — F411 Generalized anxiety disorder: Secondary | ICD-10-CM | POA: Diagnosis not present

## 2020-04-19 DIAGNOSIS — F329 Major depressive disorder, single episode, unspecified: Secondary | ICD-10-CM

## 2020-04-19 DIAGNOSIS — F4321 Adjustment disorder with depressed mood: Secondary | ICD-10-CM | POA: Diagnosis not present

## 2020-04-19 DIAGNOSIS — F32A Depression, unspecified: Secondary | ICD-10-CM

## 2020-04-19 DIAGNOSIS — R5383 Other fatigue: Secondary | ICD-10-CM

## 2020-04-19 NOTE — Progress Notes (Signed)
Patient ID: Misty Burke, female    DOB: February 13, 1956  Age: 64 y.o. MRN: [id number]  Chief Complaint  Patient presents with  . Nasal Congestion    pt has had some issues with nasal congestion, pain, and some headaches, pt reports this has been going on for quite some time, pt reports being tired, pt also reports dizzy spells and vertigo    Subjective:   Patient is here with the above documented complaints.  Actually she has made several phone calls in the last week or so here.  She wanted to see a ENT, then a neurologist.  Apparently she came in to get those referrals.  She complains of some nasal congestion.  She has had some headaches.  She has had some atypical spells 3 or 4 times.  Sometimes almost a flashing light sensation that then she passed out for almost did so for about a minute.  Sometimes had aching.  Sometimes nausea.  It is hard to get a clear consistent history.  She has been grieving a lot.  Her mother died about 7 or 8 years ago and she still is not sorting through her mother's stuff and grieves her brother's loss.  Then 2 or 3 years ago her sister died adding to her..  She says she has been to grief share without relief.  In fact she does not like it.  She has not gotten counseling.  She is fatigued.  She knows her weight is a problem.  We had a long conversation about this and numerous things.  The headache eating seems to be the main thing along with the anxiety and depression and grief.  Current allergies, medications, problem list, past/family and social histories reviewed.  Objective:  BP 124/77   Pulse 78   Temp 98.7 F (37.1 C) (Temporal)   Resp 15   Ht 5\' 10"  (1.778 m)   Wt 247 lb (112 kg)   LMP  (LMP Unknown)   SpO2 99%   BMI 35.44 kg/m   Alert and oriented.  TMs normal.  Eyes PERRL.  EOMs intact.  Throat clear.  Neck supple.  Chest clear.  Heart regular without murmur.  Weight is noted.  Vitals are noted.  Assessment & Plan:   Assessment: 1. Acute  nonintractable headache, unspecified headache type   2. Sinus congestion   3. Anxiety state   4. Grief   5. Fatigue due to depression   6. Overweight   7. Visual disturbance       Plan: Consider an SSRI sometime if she decides she is willing to take it.  In the meantime we are checking labs.  I do not think she needs a brain scan, but I am sending her to her neurologist that I will let them decide.  I told her that nothing that I saw on exam or history pointed to anything going on intracranially.  She does need to see her eye doctor sometime.  Orders Placed This Encounter  Procedures  . Comprehensive metabolic panel  . TSH  . Hemoglobin A1c  . Ambulatory referral to Neurology    Referral Priority:   Routine    Referral Type:   Consultation    Referral Reason:   Specialty Services Required    Requested Specialty:   Neurology    Number of Visits Requested:   1    No orders of the defined types were placed in this encounter.        Patient Instructions  Referral is being made to a neurologist  I recommend you see your ophthalmologist again  Labs are being checked today.  And the results will be back in a few days.  Do more regular walking and exercise  Use the fluticasone nose spray 2 sprays each nostril twice daily for 3 days, then reduce to once daily  If you decide you want to be on an antianxiety/antidepressant medication we can prescribe you 1.  Follow-up with Dr. Chilton Si as necessary.    If you have lab work done today you will be contacted with your lab results within the next 2 weeks.  If you have not heard from [place] then please contact us. The fastest way to get your results is to register for My Chart.   IF you received an x-ray today, you will receive an invoice from [facility]. Please contact [facility] at [phone] with questions or concerns regarding your invoice.   IF you received labwork today, you will receive an invoice  from [place]. Please contact [facility] at [phone] with questions or concerns regarding your invoice.   Our billing staff will not be able to assist you with questions regarding bills from these companies.  You will be contacted with the lab results as soon as they are available. The fastest way to get your results is to activate your My Chart account. Instructions are located on the last page of this paperwork. If you have not heard from [place] regarding the results in 2 weeks, please contact this office.        Return if symptoms worsen or fail to improve.   Janace Hoard, MD 04/19/2020

## 2020-04-19 NOTE — Telephone Encounter (Signed)
LVMTCB to sch pt referral appt for ENT. Please advise.

## 2020-04-19 NOTE — Patient Instructions (Addendum)
  Referral is being made to a neurologist  I recommend you see your ophthalmologist again  Labs are being checked today.  And the results will be back in a few days.  Do more regular walking and exercise  Use the fluticasone nose spray 2 sprays each nostril twice daily for 3 days, then reduce to once daily  If you decide you want to be on an antianxiety/antidepressant medication we can prescribe you 1.  Follow-up with Dr. Chilton Si as necessary.    If you have lab work done today you will be contacted with your lab results within the next 2 weeks.  If you have not heard from [place] then please contact us. The fastest way to get your results is to register for My Chart.   IF you received an x-ray today, you will receive an invoice from [facility]. Please contact [facility] at [phone] with questions or concerns regarding your invoice.   IF you received labwork today, you will receive an invoice from [place]. Please contact [facility] at [phone] with questions or concerns regarding your invoice.   Our billing staff will not be able to assist you with questions regarding bills from these companies.  You will be contacted with the lab results as soon as they are available. The fastest way to get your results is to activate your My Chart account. Instructions are located on the last page of this paperwork. If you have not heard from [place] regarding the results in 2 weeks, please contact this office.

## 2020-04-20 LAB — COMPREHENSIVE METABOLIC PANEL
ALT: 16 IU/L (ref 0–32)
AST: 17 IU/L (ref 0–40)
Albumin/Globulin Ratio: 1.5 (ref 1.2–2.2)
Albumin: 4.3 g/dL (ref 3.8–4.8)
Alkaline Phosphatase: 111 IU/L (ref 44–121)
BUN/Creatinine Ratio: 14 (ref 12–28)
BUN: 13 mg/dL (ref 8–27)
Bilirubin Total: 0.3 mg/dL (ref 0.0–1.2)
CO2: 23 mmol/L (ref 20–29)
Calcium: 9.6 mg/dL (ref 8.7–10.3)
Chloride: 102 mmol/L (ref 96–106)
Creatinine, Ser: 0.93 mg/dL (ref 0.57–1.00)
GFR calc Af Amer: 76 mL/min/{1.73_m2} (ref >59)
GFR calc non Af Amer: 66 mL/min/{1.73_m2} (ref >59)
Globulin, Total: 2.9 g/dL (ref 1.5–4.5)
Glucose: 75 mg/dL (ref 65–99)
Potassium: 4.5 mmol/L (ref 3.5–5.2)
Sodium: 138 mmol/L (ref 134–144)
Total Protein: 7.2 g/dL (ref 6.0–8.5)

## 2020-04-20 LAB — TSH: TSH: 2.07 u[IU]/mL (ref 0.450–4.500)

## 2020-04-20 LAB — HEMOGLOBIN A1C
Est. average glucose Bld gHb Est-mCnc: 120 mg/dL
Hgb A1c MFr Bld: 5.8 % — ABNORMAL HIGH (ref 4.8–5.6)

## 2020-04-20 NOTE — Progress Notes (Signed)
Call: Labs good except the Hemoglobin A1c , a test for diabetes, is slowly creeping upward into the prediabetic range.  It should be less than 5.6 and yours is up to 5.8, with 6.5 being the diabetic range.  Work hard on watching your weight and decreasing intake of carbohydrates and sweets.  You should try and get regular exercise.  This should be repeated probably in 6 months to 1 year.

## 2020-04-23 ENCOUNTER — Telehealth: Payer: Self-pay | Admitting: Family Medicine

## 2020-04-23 ENCOUNTER — Other Ambulatory Visit: Payer: Self-pay | Admitting: Family Medicine

## 2020-04-23 ENCOUNTER — Ambulatory Visit
Admission: RE | Admit: 2020-04-23 | Discharge: 2020-04-23 | Disposition: A | Discharge status: Home / Self Care | Payer: Medicare Other | Source: Ambulatory Visit | Attending: Family Medicine | Admitting: Family Medicine

## 2020-04-23 ENCOUNTER — Other Ambulatory Visit: Payer: Self-pay

## 2020-04-23 DIAGNOSIS — Z1231 Encounter for screening mammogram for malignant neoplasm of breast: Secondary | ICD-10-CM

## 2020-04-23 NOTE — Telephone Encounter (Signed)
t is calling back for her lab results. Please advise.

## 2020-04-23 NOTE — Telephone Encounter (Signed)
Called no answer

## 2020-04-23 NOTE — Telephone Encounter (Signed)
Pt was her fpr mammogram/ but had to reschedule because it had not been 6-8 since her last vaccine . Please reach out to pt to reschedule for mobile mammogram

## 2020-04-23 NOTE — Telephone Encounter (Signed)
Please assist with this.  Thanks 

## 2020-04-23 NOTE — Telephone Encounter (Signed)
Pt is needing a referral for eye doctor    Patient t is wanting to follow up om that request

## 2020-04-23 NOTE — Telephone Encounter (Signed)
We only had the mobile unit today not sure when the next appt are going to be scheduled. Will keep your name and call when we have another date

## 2020-04-23 NOTE — Telephone Encounter (Signed)
Pt requesting referral to Opthalmology. Can you place this order or do you need new visit

## 2020-04-24 ENCOUNTER — Encounter (INDEPENDENT_AMBULATORY_CARE_PROVIDER_SITE_OTHER): Payer: Medicare Other | Admitting: Ophthalmology

## 2020-04-24 ENCOUNTER — Encounter (INDEPENDENT_AMBULATORY_CARE_PROVIDER_SITE_OTHER): Payer: Self-pay

## 2020-04-24 ENCOUNTER — Other Ambulatory Visit: Payer: Self-pay

## 2020-04-24 DIAGNOSIS — H539 Unspecified visual disturbance: Secondary | ICD-10-CM

## 2020-04-24 NOTE — Telephone Encounter (Signed)
Pt is needing a referral to Ophthalmology. Pt states that her Ophthalmology office stated that she needs a normal eye doctor. Please advise.

## 2020-04-27 ENCOUNTER — Encounter: Payer: Self-pay | Admitting: Family Medicine

## 2020-04-30 ENCOUNTER — Ambulatory Visit (INDEPENDENT_AMBULATORY_CARE_PROVIDER_SITE_OTHER): Payer: Medicare Other | Admitting: Ophthalmology

## 2020-04-30 ENCOUNTER — Other Ambulatory Visit: Payer: Self-pay

## 2020-04-30 ENCOUNTER — Encounter (INDEPENDENT_AMBULATORY_CARE_PROVIDER_SITE_OTHER): Payer: Self-pay | Admitting: Ophthalmology

## 2020-04-30 DIAGNOSIS — H43812 Vitreous degeneration, left eye: Secondary | ICD-10-CM

## 2020-04-30 DIAGNOSIS — H33321 Round hole, right eye: Secondary | ICD-10-CM | POA: Diagnosis not present

## 2020-04-30 NOTE — Patient Instructions (Signed)
Vitreous Detachment  Vitreous detachment is part of the normal aging process in the eyes. Vitreous is the jelly-like substance that makes up most of the inside of the eyeballs. It helps the eyeballs keep a round shape. The vitreous is attached to the retina of the eye with a series of fibers. As you age, the vitreous gradually shrinks. Tension increases between the fibers and the retina. Eventually, the fibers can break free from the retina, causing vitreous detachment. In most cases, this does not cause problems and does not require treatment. However, it can sometimes cause the retina to separate from the eyeball (retinal detachment), which requires treatment to prevent vision loss. What are the causes? Aging is the main cause of vitreous detachment. Everyone's vitreous naturally shrinks with age. What increases the risk? You are more likely to have vitreous detachment if you:  Are at least 63 years old.  Have inflammation of the eye.  Have an eye injury.  Have had eye surgery.  Have a hemorrhage in your eye.  Are very nearsighted (myopia).  Have diabetes. What are the signs or symptoms? Most people with this condition will not notice any symptoms. If symptoms do occur, the most common are floaters. Floaters occur as the vitreous begins to shrink. They may:  Appear as tiny dots or webs in your vision.  Seem to disappear when you look at them directly.  Appear more often as your condition gets worse. Other symptoms include:  Flashes of light (photopsia) in your peripheral vision that may look like lightning streaks.  Decreased vision or a dark curtain or shadow moving across your field of vision. This is rare. How is this diagnosed? This condition may be diagnosed based on:  Your signs and symptoms.  An exam by a health care provider who specializes in conditions and diseases of the eye (ophthalmologist). The exam may include: ? Putting eye drops in your eye to make the  pupil wider (dilated). The pupil is the opening in the center of the eye. ? Checking the pupils with a magnifying glass. This exam is the best way to determine the type and extent of damage to your eye. How is this treated? For most people, a vitreous detachment is harmless, causing no symptoms or vision loss, and does not require treatment. Floaters usually become less noticeable over time. If the condition causes retinal detachment, you may need eye surgery to reattach your retina (reattachment surgery) in order to prevent vision loss or restore your vision. Follow these instructions at home:  Keep all follow-up visits as told by your health care provider. This is important. Get help right away if:  You develop signs of retinal detachment. These include: ? A sudden increase in the number of floaters you see. ? An increase in the number of flashes of light you see in your peripheral vision. ? Decreased vision. Summary  Vitreous detachment is part of the normal aging process in the eyes.  Vitreous is the jelly-like substance inside the eyeballs. As you age, the vitreous shrinks, and the fibers that attach the vitreous to the retina can break free, causing vitreous detachment.  In most cases, vitreous detachment does not cause symptoms and does not require treatment. The most common symptom that can occur is seeing floaters that appear as tiny dots or webs in your vision.  Vitreous detachment can sometimes cause the retina to separate from the eyeball (retinal detachment). This must be treated to prevent vision loss. This information is not   intended to replace advice given to you by your health care provider. Make sure you discuss any questions you have with your health care provider. Document Revised: 06/26/2017 Document Reviewed: 06/04/2017 Elsevier Patient Education  2020 Elsevier Inc.  

## 2020-04-30 NOTE — Progress Notes (Signed)
04/30/2020     CHIEF COMPLAINT Patient presents for Retina Evaluation   HISTORY OF PRESENT ILLNESS: Misty Burke is a 64 y.o. female who presents to the clinic today for:   HPI    Retina Evaluation    In right eye.  This started 1.5 years ago.  Duration of 1.5 years.  Associated Symptoms Floaters.  Context:  distance vision, mid-range vision and near vision.  Treatments tried include no treatments.  Response to treatment was no improvement.          Comments    NP Eval for floaters OD/blurred VA OS  Pt c/o constant floaters OD x 1.5 years since 06/2018. Pt c/o blurry VA off and on OS x 1 month. Pt reports intermittent episodes of "flashy blurness" OS. Pt c/o intermittent dizziness.  Pt denies floaters OS. Pt c/o sinus problems lately and sinus HA.       Last edited by Ileana Roup, COA on 04/30/2020  3:05 PM. (History)      Referring physician: Shade Flood, MD [address] [place],  [place] [address]  HISTORICAL INFORMATION:   Selected notes from the MEDICAL RECORD NUMBER    Lab Results  Component Value Date   HGBA1C 5.8 (H) 04/19/2020     CURRENT MEDICATIONS: No current outpatient medications on file. (Ophthalmic Drugs)   No current facility-administered medications for this visit. (Ophthalmic Drugs)   Current Outpatient Medications (Other)  Medication Sig  . Ascorbic Acid (VITAMIN C) 1000 MG tablet Take 1,000 mg by mouth daily.  Marland Kitchen aspirin EC 81 MG tablet Take 81 mg by mouth daily.  . B Complex Vitamins (B COMPLEX 1 PO) Take by mouth.  . cholecalciferol (VITAMIN D3) 25 MCG (1000 UT) tablet Take 1,000 Units by mouth daily.  . fexofenadine (ALLEGRA) 180 MG tablet Take 180 mg by mouth as needed.   . fluticasone (FLONASE) 50 MCG/ACT nasal spray PLACE 1 TO 2 SPRAYS INTO BOTH NOSTRILS AT BEDTIME  . Multiple Vitamin (MULTIVITAMIN) tablet Take 1 tablet by mouth daily.  Marland Kitchen omeprazole (PRILOSEC) 20 MG capsule TAKE 1 CAPSULE BY MOUTH EVERY DAY   Current  Facility-Administered Medications (Other)  Medication Route  . 0.9 %  sodium chloride infusion Intravenous      REVIEW OF SYSTEMS:    ALLERGIES No Known Allergies  PAST MEDICAL HISTORY Past Medical History:  Diagnosis Date  . Allergy   . Angio-edema   . Anxiety   . Depression    situational after death of her mother   . GERD (gastroesophageal reflux disease)   . Sleep apnea    no CPAP  . Urticaria    Past Surgical History:  Procedure Laterality Date  . BREAST CYST ASPIRATION Left   . COLONOSCOPY    . UPPER GASTROINTESTINAL ENDOSCOPY      FAMILY HISTORY Family History  Problem Relation Age of Onset  . Cancer Other   . Cancer Father   . Diabetes Neg Hx   . Hypertension Neg Hx   . Breast cancer Neg Hx   . Colon cancer Neg Hx   . Esophageal cancer Neg Hx   . Rectal cancer Neg Hx   . Stomach cancer Neg Hx   . Colon polyps Neg Hx     SOCIAL HISTORY Social History   Tobacco Use  . Smoking status: Never Smoker  . Smokeless tobacco: Never Used  Vaping Use  . Vaping Use: Never used  Substance Use Topics  . Alcohol use:  Yes    Comment: socially   . Drug use: No         OPHTHALMIC EXAM: Base Eye Exam    Visual Acuity (ETDRS)      Right Left   Dist [place] 20/20 20/20       Tonometry (Tonopen, 3:02 PM)      Right Left   Pressure 13 09       Pupils      Pupils Dark Light Shape React APD   Right PERRL 3 2 Round Brisk None   Left PERRL 3 2 Round Brisk None       Visual Fields (Counting fingers)      Left Right    Full Full       Extraocular Movement      Right Left    Full Full       Neuro/Psych    Oriented x3: Yes   Mood/Affect: Normal       Dilation    Both eyes: 1.0% Mydriacyl, 2.5% Phenylephrine @ 3:07 PM        Slit Lamp and Fundus Exam    External Exam      Right Left   External Normal Normal       Slit Lamp Exam      Right Left   Lids/Lashes Normal Normal   Conjunctiva/Sclera White and quiet White and quiet   Cornea  Clear Clear   Anterior Chamber Deep and quiet Deep and quiet   Iris Round and reactive Round and reactive   Lens 1+ Nuclear sclerosis 1+ Nuclear sclerosis   Anterior Vitreous Normal Normal       Fundus Exam      Right Left   Posterior Vitreous Clear posterior vitreous detachment Posterior vitreous detachment, no pigment   Disc Normal Normal   C/D Ratio 0.35 0.35   Macula Normal Normal   Vessels Normal Normal   Periphery Break at 1 and 6, well treated, no new breaks, no view inferiorly White without pressure superiorly,, no holes seen in the superior 180 degrees.closure of the eye and nervousness precludes evaluation of the inferior retina, with scleral depression.          IMAGING AND PROCEDURES  Imaging and Procedures for 04/30/20  OCT, Retina - OU - Both Eyes       Right Eye Central Foveal Thickness: 253. Progression has no prior data.   Left Eye Quality was good. Scan locations included subfoveal. Central Foveal Thickness: 245. Progression has no prior data.   Notes With posterior vitreous detachment evident OS, likely explains patient's symptoms.       Color Fundus Photography Optos - OU - Both Eyes       Right Eye Progression has no prior data. Disc findings include normal observations. Macula : normal observations. Vessels : normal observations. Periphery : normal observations.   Left Eye Progression has no prior data. Disc findings include normal observations. Macula : normal observations. Vessels : normal observations. Periphery : normal observations.   Notes No retinal holes or tears depicted on the pictures OD or OS.  Media OU                ASSESSMENT/PLAN:  No problem-specific Assessment & Plan notes found for this encounter.      ICD-10-CM   1. Posterior vitreous detachment of left eye  H43.812 OCT, Retina - OU - Both Eyes  2. Retinal holes, right  H33.321 Color Fundus Photography Optos - OU -  Both Eyes    1. NO  Specific therapy  warranted today.  No retinal holes or tears in the left eye and no signs of a retinal hole or tear  2.  History of retinal holes in the right eye, good laser retinopexy and no new breaks  3.  Ophthalmic Meds Ordered this visit:  No orders of the defined types were placed in this encounter.      Return in about 8 weeks (around 06/25/2020) for OS, COLOR FP.  There are no Patient Instructions on file for this visit.   Explained the diagnoses, plan, and follow up with the patient and they expressed understanding.  Patient expressed understanding of the importance of proper follow up care.   Alford Highland Darry Kelnhofer M.D. Diseases & Surgery of the Retina and Vitreous [facility] 04/30/20     Abbreviations: M myopia (nearsighted); A astigmatism; H hyperopia (farsighted); P presbyopia; Mrx spectacle prescription;  CTL contact lenses; OD right eye; OS left eye; OU both eyes  XT exotropia; ET esotropia; PEK punctate epithelial keratitis; PEE punctate epithelial erosions; DES dry eye syndrome; MGD meibomian gland dysfunction; ATs artificial tears; PFAT's preservative free artificial tears; NSC nuclear sclerotic cataract; PSC posterior subcapsular cataract; ERM epi-retinal membrane; PVD posterior vitreous detachment; RD retinal detachment; DM diabetes mellitus; DR diabetic retinopathy; NPDR non-proliferative diabetic retinopathy; PDR proliferative diabetic retinopathy; CSME clinically significant macular edema; DME diabetic macular edema; dbh dot blot hemorrhages; CWS cotton wool spot; POAG primary open angle glaucoma; C/D cup-to-disc ratio; HVF humphrey visual field; GVF goldmann visual field; OCT optical coherence tomography; IOP intraocular pressure; BRVO Branch retinal vein occlusion; CRVO central retinal vein occlusion; CRAO central retinal artery occlusion; BRAO branch retinal artery occlusion; RT retinal tear; SB scleral buckle; PPV pars plana vitrectomy; VH Vitreous hemorrhage; PRP  panretinal laser photocoagulation; IVK intravitreal kenalog; VMT vitreomacular traction; MH Macular hole;  NVD neovascularization of the disc; NVE neovascularization elsewhere; AREDS age related eye disease study; ARMD age related macular degeneration; POAG primary open angle glaucoma; EBMD epithelial/anterior basement membrane dystrophy; ACIOL anterior chamber intraocular lens; IOL intraocular lens; PCIOL posterior chamber intraocular lens; Phaco/IOL phacoemulsification with intraocular lens placement; PRK photorefractive keratectomy; LASIK laser assisted in situ keratomileusis; HTN hypertension; DM diabetes mellitus; COPD chronic obstructive pulmonary disease

## 2020-04-30 NOTE — Assessment & Plan Note (Signed)

## 2020-05-21 ENCOUNTER — Encounter: Payer: Self-pay | Admitting: *Deleted

## 2020-06-06 ENCOUNTER — Ambulatory Visit
Admission: RE | Admit: 2020-06-06 | Discharge: 2020-06-06 | Disposition: A | Discharge status: Home / Self Care | Payer: Medicare Other | Source: Ambulatory Visit | Attending: Family Medicine | Admitting: Family Medicine

## 2020-06-06 ENCOUNTER — Other Ambulatory Visit: Payer: Self-pay

## 2020-06-06 DIAGNOSIS — Z1231 Encounter for screening mammogram for malignant neoplasm of breast: Secondary | ICD-10-CM

## 2020-06-27 ENCOUNTER — Telehealth: Payer: Self-pay | Admitting: *Deleted

## 2020-06-27 ENCOUNTER — Ambulatory Visit: Payer: Medicare Other | Admitting: Diagnostic Neuroimaging

## 2020-06-27 NOTE — Telephone Encounter (Signed)
Patient was no show for new patient appointment today. 

## 2020-06-28 ENCOUNTER — Ambulatory Visit (INDEPENDENT_AMBULATORY_CARE_PROVIDER_SITE_OTHER): Payer: Medicare Other | Admitting: Ophthalmology

## 2020-06-28 ENCOUNTER — Other Ambulatory Visit: Payer: Self-pay

## 2020-06-28 ENCOUNTER — Encounter (INDEPENDENT_AMBULATORY_CARE_PROVIDER_SITE_OTHER): Payer: Self-pay | Admitting: Ophthalmology

## 2020-06-28 DIAGNOSIS — H43812 Vitreous degeneration, left eye: Secondary | ICD-10-CM | POA: Diagnosis not present

## 2020-06-28 DIAGNOSIS — H43811 Vitreous degeneration, right eye: Secondary | ICD-10-CM | POA: Insufficient documentation

## 2020-06-28 NOTE — Progress Notes (Signed)
06/28/2020     CHIEF COMPLAINT Patient presents for Retina Follow Up   HISTORY OF PRESENT ILLNESS: Misty Burke is a 64 y.o. female who presents to the clinic today for:   HPI    Retina Follow Up    Patient presents with  Other.  In left eye.  This started 8 weeks ago.  Severity is mild.  Duration of 8 weeks.  Since onset it is stable.          Comments    8 Week F/U OS  Pt denies new symptoms OS. Pt reports intermittent flashes OD.       Last edited by Ileana Roup, COA on 06/28/2020  1:49 PM. (History)      Referring physician: Shade Flood, MD [address] [place],  [place] [address]  HISTORICAL INFORMATION:   Selected notes from the MEDICAL RECORD NUMBER    Lab Results  Component Value Date   HGBA1C 5.8 (H) 04/19/2020     CURRENT MEDICATIONS: No current outpatient medications on file. (Ophthalmic Drugs)   No current facility-administered medications for this visit. (Ophthalmic Drugs)   Current Outpatient Medications (Other)  Medication Sig  . Ascorbic Acid (VITAMIN C) 1000 MG tablet Take 1,000 mg by mouth daily.  Marland Kitchen aspirin EC 81 MG tablet Take 81 mg by mouth daily.  . B Complex Vitamins (B COMPLEX 1 PO) Take by mouth.  . cholecalciferol (VITAMIN D3) 25 MCG (1000 UT) tablet Take 1,000 Units by mouth daily.  . fexofenadine (ALLEGRA) 180 MG tablet Take 180 mg by mouth as needed.   . fluticasone (FLONASE) 50 MCG/ACT nasal spray PLACE 1 TO 2 SPRAYS INTO BOTH NOSTRILS AT BEDTIME  . Multiple Vitamin (MULTIVITAMIN) tablet Take 1 tablet by mouth daily.  Marland Kitchen omeprazole (PRILOSEC) 20 MG capsule TAKE 1 CAPSULE BY MOUTH EVERY DAY   Current Facility-Administered Medications (Other)  Medication Route  . 0.9 %  sodium chloride infusion Intravenous      REVIEW OF SYSTEMS:    ALLERGIES No Known Allergies  PAST MEDICAL HISTORY Past Medical History:  Diagnosis Date  . Allergy   . Angio-edema   . Anxiety   . Depression    situational after death of  her mother   . GERD (gastroesophageal reflux disease)   . Sleep apnea    no CPAP  . Urticaria    Past Surgical History:  Procedure Laterality Date  . BREAST CYST ASPIRATION Left   . COLONOSCOPY    . UPPER GASTROINTESTINAL ENDOSCOPY      FAMILY HISTORY Family History  Problem Relation Age of Onset  . Cancer Other   . Cancer Father   . Diabetes Neg Hx   . Hypertension Neg Hx   . Breast cancer Neg Hx   . Colon cancer Neg Hx   . Esophageal cancer Neg Hx   . Rectal cancer Neg Hx   . Stomach cancer Neg Hx   . Colon polyps Neg Hx     SOCIAL HISTORY Social History   Tobacco Use  . Smoking status: Never Smoker  . Smokeless tobacco: Never Used  Vaping Use  . Vaping Use: Never used  Substance Use Topics  . Alcohol use: Yes    Comment: socially   . Drug use: No         OPHTHALMIC EXAM:  Base Eye Exam    Visual Acuity (ETDRS)      Right Left   Dist [place] 20/25 +2 20/20  Tonometry (Tonopen, 1:49 PM)      Right Left   Pressure 15 19       Pupils      Pupils Dark Light Shape React APD   Right PERRL 3 2 Round Brisk None   Left PERRL 3 2 Round Brisk None       Visual Fields (Counting fingers)      Left Right    Full Full       Extraocular Movement      Right Left    Full Full       Neuro/Psych    Oriented x3: Yes   Mood/Affect: Normal       Dilation    Left eye: 1.0% Mydriacyl, 2.5% Phenylephrine @ 1:52 PM        Slit Lamp and Fundus Exam    External Exam      Right Left   External Normal Normal       Slit Lamp Exam      Right Left   Lids/Lashes Normal Normal   Conjunctiva/Sclera White and quiet White and quiet   Cornea Clear Clear   Anterior Chamber Deep and quiet Deep and quiet   Iris Round and reactive Round and reactive   Lens 1+ Nuclear sclerosis 1+ Nuclear sclerosis   Anterior Vitreous Normal Normal       Fundus Exam      Right Left   Posterior Vitreous  Posterior vitreous detachment, no pigment   Disc  Normal   C/D Ratio   0.35   Macula  Normal   Vessels  Normal   Periphery  White without pressure superiorly,, no holes seen in the 360 degrees, excellent view 360 to the ora serrata with no holes or tears          IMAGING AND PROCEDURES  Imaging and Procedures for 06/28/20  Color Fundus Photography Optos - OU - Both Eyes       Right Eye Progression has been stable. Disc findings include normal observations. Macula : normal observations. Vessels : normal observations. Periphery : normal observations.   Left Eye Progression has been stable. Disc findings include normal observations. Macula : normal observations. Vessels : normal observations. Periphery : normal observations.   Notes Vitreous strands and posterior vitreous detachment imaged on color fundus photography and red-free                ASSESSMENT/PLAN:  Posterior vitreous detachment of left eye   The nature of posterior vitreous detachment was discussed with the patient as well as its physiology, its age prevalence, and its possible implication regarding retinal breaks and detachment.  An informational brochure was given to the patient.  All the patient's questions were answered.  The patient was asked to return if new or different flashes or floaters develops.   Patient was instructed to contact office immediately if any changes were noticed. I explained to the patient that vitreous inside the eye is similar to jello inside a bowl. As the jello melts it can start to pull away from the bowl, similarly the vitreous throughout our lives can begin to pull away from the retina. That process is called a posterior vitreous detachment. In some cases, the vitreous can tug hard enough on the retina to form a retinal tear. I discussed with the patient the signs and symptoms of a retinal detachment.  Do not rub the eye.      ICD-10-CM   1. Posterior vitreous detachment of left  eye  H43.812 Color Fundus Photography Optos - OU - Both Eyes  2.  Posterior vitreous detachment of right eye  H43.811     1. No retinal holes or tears with recent posterior vitreous detachment and/or syneresis type symptoms will observe  2. Posterior vitreous detachment well imaged on color fundus photography OD today  3.  Ophthalmic Meds Ordered this visit:  No orders of the defined types were placed in this encounter.      Return in about 2 years (around 06/28/2022).  There are no Patient Instructions on file for this visit.   Explained the diagnoses, plan, and follow up with the patient and they expressed understanding.  Patient expressed understanding of the importance of proper follow up care.   Alford Highland Kodi Guerrera M.D. Diseases & Surgery of the Retina and Vitreous [facility] 06/28/20     Abbreviations: M myopia (nearsighted); A astigmatism; H hyperopia (farsighted); P presbyopia; Mrx spectacle prescription;  CTL contact lenses; OD right eye; OS left eye; OU both eyes  XT exotropia; ET esotropia; PEK punctate epithelial keratitis; PEE punctate epithelial erosions; DES dry eye syndrome; MGD meibomian gland dysfunction; ATs artificial tears; PFAT's preservative free artificial tears; NSC nuclear sclerotic cataract; PSC posterior subcapsular cataract; ERM epi-retinal membrane; PVD posterior vitreous detachment; RD retinal detachment; DM diabetes mellitus; DR diabetic retinopathy; NPDR non-proliferative diabetic retinopathy; PDR proliferative diabetic retinopathy; CSME clinically significant macular edema; DME diabetic macular edema; dbh dot blot hemorrhages; CWS cotton wool spot; POAG primary open angle glaucoma; C/D cup-to-disc ratio; HVF humphrey visual field; GVF goldmann visual field; OCT optical coherence tomography; IOP intraocular pressure; BRVO Branch retinal vein occlusion; CRVO central retinal vein occlusion; CRAO central retinal artery occlusion; BRAO branch retinal artery occlusion; RT retinal tear; SB scleral buckle; PPV  pars plana vitrectomy; VH Vitreous hemorrhage; PRP panretinal laser photocoagulation; IVK intravitreal kenalog; VMT vitreomacular traction; MH Macular hole;  NVD neovascularization of the disc; NVE neovascularization elsewhere; AREDS age related eye disease study; ARMD age related macular degeneration; POAG primary open angle glaucoma; EBMD epithelial/anterior basement membrane dystrophy; ACIOL anterior chamber intraocular lens; IOL intraocular lens; PCIOL posterior chamber intraocular lens; Phaco/IOL phacoemulsification with intraocular lens placement; PRK photorefractive keratectomy; LASIK laser assisted in situ keratomileusis; HTN hypertension; DM diabetes mellitus; COPD chronic obstructive pulmonary disease

## 2020-06-28 NOTE — Assessment & Plan Note (Signed)

## 2020-09-03 ENCOUNTER — Ambulatory Visit: Payer: Medicare Other | Admitting: Neurology

## 2020-09-03 ENCOUNTER — Encounter: Payer: Self-pay | Admitting: Neurology

## 2020-09-04 ENCOUNTER — Telehealth: Payer: Self-pay | Admitting: Family Medicine

## 2020-09-04 NOTE — Telephone Encounter (Addendum)
09/04/2020 - PATIENT WANTS DR. HOPPER TO KNOW WHAT HAPPENED WHEN SHE WENT TO [facility] FOR THE REFERRAL ON HER HEADACHES. AT HER FIRST APPOINTMENT SHE COULD NOT FIND THEIR OFFICE. SHE WAS RESCHEDULED FOR 09/03/20. HER APPOINTMENT WAS AT 7:30am AND SHE LEFT HER CAR AT 7:25am. IT WAS THE MORNING OF THE ICE SO SHE WALKED SLOWLY. WHEN SHE GOT THERE THEY TOLD HER IT WAS 7:35am AND THAT THEIR GRACE PERIOD IS 4 MINUTES. THEY WANTED HER TO RESCHEDULE AGAIN. SHE WILL NOT GO BACK. SHE THINKS SHE WAS DEHYDRATED FROM THE COVID SHOT THAT WAS CAUSING THE HEADACHES. SHE SAW THE EYE DOCTOR FOR HER VISUAL PROBLEMS.  BEST PHONE [phone] (CELL) MBC

## 2020-09-05 NOTE — Telephone Encounter (Signed)
I have called pt and left a message to call back.   Plan: To tell her so sorry she was getting the run around and if she needs anything for [place] to let us know.

## 2020-09-06 NOTE — Telephone Encounter (Signed)
This below msg is a [employer]

## 2020-09-07 ENCOUNTER — Encounter (INDEPENDENT_AMBULATORY_CARE_PROVIDER_SITE_OTHER): Payer: Medicare (Managed Care) | Admitting: Family Medicine

## 2020-09-07 NOTE — Progress Notes (Signed)
I sent the patient a letter regarding her phone calls.  Misty Burke. Alwyn Ren, MD

## 2020-09-13 NOTE — Telephone Encounter (Signed)
Pt calling back stating that she received the letter from Dr. Alwyn Ren. She states that she would like him to know that she was only giving the information regarding the neurologist due to the experience she had for future pts who referred to the same clinic. She states that she is very thankful for Dr. Alwyn Ren and wishes him luck at his next job. She states that she would not like to pursue another referral for neurology due to a recent visit she had with health dept her headaches seem to have went away. Please advise.

## 2020-11-26 ENCOUNTER — Other Ambulatory Visit: Payer: Self-pay

## 2020-11-26 ENCOUNTER — Ambulatory Visit: Payer: Medicare (Managed Care) | Admitting: Physician Assistant

## 2020-11-26 VITALS — BP 117/72 | HR 78 | Temp 98.2°F | Resp 18 | Ht 70.0 in | Wt 235.0 lb

## 2020-11-26 DIAGNOSIS — H43811 Vitreous degeneration, right eye: Secondary | ICD-10-CM | POA: Diagnosis not present

## 2020-11-26 DIAGNOSIS — R42 Dizziness and giddiness: Secondary | ICD-10-CM

## 2020-11-26 DIAGNOSIS — R5383 Other fatigue: Secondary | ICD-10-CM

## 2020-11-26 DIAGNOSIS — F3289 Other specified depressive episodes: Secondary | ICD-10-CM

## 2020-11-26 DIAGNOSIS — J302 Other seasonal allergic rhinitis: Secondary | ICD-10-CM

## 2020-11-26 DIAGNOSIS — F4321 Adjustment disorder with depressed mood: Secondary | ICD-10-CM

## 2020-11-26 DIAGNOSIS — R7303 Prediabetes: Secondary | ICD-10-CM

## 2020-11-26 LAB — POCT GLYCOSYLATED HEMOGLOBIN (HGB A1C): Hemoglobin A1C: 5.4 % (ref 4.0–5.6)

## 2020-11-26 NOTE — Patient Instructions (Signed)
I encourage you to follow-up with your eye doctor as soon as possible.  I encourage you to take the Allegra on a daily basis throughout the rest of this allergy season.  I encourage you to work on daily exercise, continuing good hydration, and improving sleep quality.  Please let us know if you would like a referral for counseling.  We will call you with your lab results.  Roney Jaffe, PA-C Physician Assistant [facility] [email or web address]   Dizziness Dizziness is a common problem. It is a feeling of unsteadiness or light-headedness. You may feel like you are about to faint. Dizziness can lead to injury if you stumble or fall. Anyone can become dizzy, but dizziness is more common in older adults. This condition can be caused by a number of things, including medicines, dehydration, or illness. Follow these instructions at home: Eating and drinking  Drink enough fluid to keep your urine clear or pale yellow. This helps to keep you from becoming dehydrated. Try to drink more clear fluids, such as water.  Do not drink alcohol.  Limit your caffeine intake if told to do so by your health care provider. Check ingredients and nutrition facts to see if a food or beverage contains caffeine.  Limit your salt (sodium) intake if told to do so by your health care provider. Check ingredients and nutrition facts to see if a food or beverage contains sodium. Activity  Avoid making quick movements. ? Rise slowly from chairs and steady yourself until you feel okay. ? In the morning, first sit up on the side of the bed. When you feel okay, stand slowly while you hold onto something until you know that your balance is fine.  If you need to stand in one place for a long time, move your legs often. Tighten and relax the muscles in your legs while you are standing.  Do not drive or use heavy machinery if you feel dizzy.  Avoid bending down if  you feel dizzy. Place items in your home so that they are easy for you to reach without leaning over. Lifestyle  Do not use any products that contain nicotine or tobacco, such as cigarettes and e-cigarettes. If you need help quitting, ask your health care provider.  Try to reduce your stress level by using methods such as yoga or meditation. Talk with your health care provider if you need help to manage your stress. General instructions  Watch your dizziness for any changes.  Take over-the-counter and prescription medicines only as told by your health care provider. Talk with your health care provider if you think that your dizziness is caused by a medicine that you are taking.  Tell a friend or a family member that you are feeling dizzy. If he or she notices any changes in your behavior, have this person call your health care provider.  Keep all follow-up visits as told by your health care provider. This is important. Contact a health care provider if:  Your dizziness does not go away.  Your dizziness or light-headedness gets worse.  You feel nauseous.  You have reduced hearing.  You have new symptoms.  You are unsteady on your feet or you feel like the room is spinning. Get help right away if:  You vomit or have diarrhea and are unable to eat or drink anything.  You have problems talking, walking, swallowing, or using your arms, hands, or legs.  You feel generally weak.  You are not thinking  clearly or you have trouble forming sentences. It may take a friend or family member to notice this.  You have chest pain, abdominal pain, shortness of breath, or sweating.  Your vision changes.  You have any bleeding.  You have a severe headache.  You have neck pain or a stiff neck.  You have a fever. These symptoms may represent a serious problem that is an emergency. Do not wait to see if the symptoms will go away. Get medical help right away. Call your local emergency  services (911 in the [place] Do not drive yourself to the hospital. Summary  Dizziness is a feeling of unsteadiness or light-headedness. This condition can be caused by a number of things, including medicines, dehydration, or illness.  Anyone can become dizzy, but dizziness is more common in older adults.  Drink enough fluid to keep your urine clear or pale yellow. Do not drink alcohol.  Avoid making quick movements if you feel dizzy. Monitor your dizziness for any changes. This information is not intended to replace advice given to you by your health care provider. Make sure you discuss any questions you have with your health care provider. Document Revised: 07/17/2017 Document Reviewed: 08/16/2016 Elsevier Patient Education  2021 [employer].

## 2020-11-26 NOTE — Progress Notes (Signed)
New Patient Office Visit  Subjective:  Patient ID: Misty Burke, female    DOB: 1955/12/24  Age: 65 y.o. MRN: [id number]  CC:  Chief Complaint  Patient presents with  . Dizziness    HPI SHI GROSE reports that she has been experiencing episodes of dizziness, fatigue, spots in her vision.  Reports that she has been having episodes of dizziness on and off ever since receiving her first COVID-vaccine, states that she was told to increase her water intake, states that this has made a great improvement.  Reports that she also feels her dizziness may be connected to seasonal allergies.  Reports that when she starts to feel itchy all over, she knows that her seasonal allergies are flaring up and she will use Allegra with some relief.  Reports that she does not like taking medications on a daily basis, prefers to treat her symptoms with holistic measures.  Reports that she has been having feelings of fatigue throughout the day despite sleeping well at night.  Reports that she does feel when she wakes up in the middle the night to use the bathroom that it disrupts her sleep and does not get good quality sleep that night.  Has not tried anything for relief.  Reports that she has been dealing with grief and feels that some of her fatigue may be attributed to depression.  Reports that she has suffered many losses in her family with recently losing a nephew.  Reports that she previously did have a counselor but failed to maintain follow-up.  Has previously spoken with her primary care provider and declines any type of medication treatment.  Adamantly denies any thoughts of self-harm.   Reports that she has spots in her vision, states they come and go, states this has been present for several years, states that she is followed by ophthalmology for this issue.   Past Medical History:  Diagnosis Date  . Allergy   . Angio-edema   . Anxiety   . Depression    situational after death of her mother    . GERD (gastroesophageal reflux disease)   . Sleep apnea    no CPAP  . Urticaria     Past Surgical History:  Procedure Laterality Date  . BREAST CYST ASPIRATION Left   . COLONOSCOPY    . UPPER GASTROINTESTINAL ENDOSCOPY      Family History  Problem Relation Age of Onset  . Cancer Other   . Cancer Father   . Diabetes Neg Hx   . Hypertension Neg Hx   . Breast cancer Neg Hx   . Colon cancer Neg Hx   . Esophageal cancer Neg Hx   . Rectal cancer Neg Hx   . Stomach cancer Neg Hx   . Colon polyps Neg Hx     Social History   Socioeconomic History  . Marital status: Unknown    Spouse name: Not on file  . Number of children: Not on file  . Years of education: Not on file  . Highest education level: Not on file  Occupational History  . Not on file  Tobacco Use  . Smoking status: Never Smoker  . Smokeless tobacco: Never Used  Vaping Use  . Vaping Use: Never used  Substance and Sexual Activity  . Alcohol use: Yes    Comment: socially   . Drug use: No  . Sexual activity: Not Currently  Other Topics Concern  . Not on file  Social History Narrative  .  Not on file   Social Determinants of Health   Financial Resource Strain: Not on file  Food Insecurity: Not on file  Transportation Needs: Not on file  Physical Activity: Not on file  Stress: Not on file  Social Connections: Not on file  Intimate Partner Violence: Not on file    ROS Review of Systems  Constitutional: Positive for fatigue. Negative for chills and fever.  HENT: Positive for congestion and sneezing.   Eyes: Positive for visual disturbance. Negative for pain, redness and itching.  Respiratory: Negative for shortness of breath.   Cardiovascular: Negative for chest pain and palpitations.  Gastrointestinal: Negative.   Endocrine: Negative.   Genitourinary: Negative.   Musculoskeletal: Negative.   Skin: Negative.   Allergic/Immunologic: Positive for environmental allergies.  Neurological: Negative  for headaches.  Hematological: Negative.   Psychiatric/Behavioral: Positive for dysphoric mood and sleep disturbance. Negative for self-injury and suicidal ideas.    Objective:   Today's Vitals: BP 117/72 (BP Location: Left Arm, Patient Position: Sitting, Cuff Size: Normal)   Pulse 78   Temp 98.2 F (36.8 C) (Oral)   Resp 18   Ht 5\' 10"  (1.778 m)   Wt 235 lb (106.6 kg)   LMP  (LMP Unknown)   SpO2 98%   BMI 33.72 kg/m   Physical Exam Vitals and nursing note reviewed.  Constitutional:      General: She is not in acute distress.    Appearance: Normal appearance. She is not ill-appearing.  HENT:     Head: Normocephalic and atraumatic.     Right Ear: Tympanic membrane, ear canal and external ear normal.     Left Ear: Tympanic membrane, ear canal and external ear normal.     Nose: Nose normal.     Mouth/Throat:     Mouth: Mucous membranes are moist.     Pharynx: Oropharynx is clear.  Eyes:     Extraocular Movements: Extraocular movements intact.     Conjunctiva/sclera: Conjunctivae normal.     Pupils: Pupils are equal, round, and reactive to light.  Cardiovascular:     Rate and Rhythm: Normal rate and regular rhythm.     Pulses: Normal pulses.     Heart sounds: Normal heart sounds.  Pulmonary:     Effort: Pulmonary effort is normal.     Breath sounds: Normal breath sounds.  Musculoskeletal:        General: Normal range of motion.     Cervical back: Normal range of motion and neck supple.  Skin:    General: Skin is warm and dry.  Neurological:     General: No focal deficit present.     Mental Status: She is alert and oriented to person, place, and time.  Psychiatric:        Mood and Affect: Mood normal.        Behavior: Behavior normal.        Thought Content: Thought content normal.        Judgment: Judgment normal.     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 Metabolic Panel (12) (Completed)   TSH (Completed)   HgB A1c (Completed)   Depression   Relevant Orders   Vitamin D,  25-hydroxy (Completed)   Seasonal allergies   Grief   Fatigue      Outpatient Encounter Medications as of 11/26/2020  Medication Sig  . Ascorbic Acid (VITAMIN C) 1000 MG tablet Take 1,000 mg by mouth daily.  Marland Kitchen aspirin EC 81 MG tablet Take 81 mg by mouth daily.  . B Complex Vitamins (B COMPLEX 1 PO) Take by mouth.  . cholecalciferol (VITAMIN D3) 25 MCG (1000 UT) tablet Take 1,000 Units by mouth daily.  . fexofenadine (ALLEGRA) 180 MG tablet Take 180 mg by mouth as needed.   . fluticasone (FLONASE) 50 MCG/ACT nasal spray PLACE 1 TO 2 SPRAYS INTO BOTH NOSTRILS AT BEDTIME  . Multiple Vitamin (MULTIVITAMIN) tablet Take 1 tablet by mouth daily.  Marland Kitchen omeprazole (PRILOSEC) 20 MG capsule TAKE 1 CAPSULE BY MOUTH EVERY DAY   Facility-Administered Encounter Medications as of 11/26/2020  Medication  . 0.9 %  sodium chloride infusion   1. Posterior vitreous detachment of right eye Reviewed last medical note from ophthalmology with patient, it does state for patient to follow-up with soon as possible if her symptoms continue or worsen.  Strongly encourage patient to make follow-up appointment, patient understands and agrees.  2. Dizziness and giddiness Previously had A1c of 6.0, A1c today 5.4.  Patient encouraged to work on reducing added sugars - HgB A1c  3. Prediabetes Current A1C WNL - CBC with Differential/Platelet - Comp. Metabolic Panel (12) - TSH - HgB A1c  4. Seasonal allergies Patient strongly encouraged to take Allegra on a daily basis throughout allergy season  5. Other depression Patient declines referral for counseling at this time - Vitamin D, 25-hydroxy  6. Grief Patient education given on dealing with grief and loss  of family members  7. Fatigue, unspecified type    I have reviewed the patient's medical history (PMH, PSH, Social History, Family History, Medications, and allergies) , and have been updated if relevant. I spent 32 minutes reviewing chart and  face to face time with patient.     Follow-up: Return if symptoms worsen or fail to improve.   Kasandra Knudsen Mayers, PA-C

## 2020-11-26 NOTE — Progress Notes (Signed)
Patient has not taken medication and patient has eaten today. Patient complains of left knee pain beginning a few days ago intermittently with an increase throughout the day. Patient reports having intermittent dizziness and water helps elleviate the concern.

## 2020-11-27 ENCOUNTER — Telehealth: Payer: Self-pay | Admitting: *Deleted

## 2020-11-27 DIAGNOSIS — F4321 Adjustment disorder with depressed mood: Secondary | ICD-10-CM | POA: Insufficient documentation

## 2020-11-27 DIAGNOSIS — J302 Other seasonal allergic rhinitis: Secondary | ICD-10-CM | POA: Insufficient documentation

## 2020-11-27 DIAGNOSIS — R5383 Other fatigue: Secondary | ICD-10-CM | POA: Insufficient documentation

## 2020-11-27 DIAGNOSIS — R7303 Prediabetes: Secondary | ICD-10-CM | POA: Insufficient documentation

## 2020-11-27 DIAGNOSIS — F32A Depression, unspecified: Secondary | ICD-10-CM | POA: Insufficient documentation

## 2020-11-27 DIAGNOSIS — R42 Dizziness and giddiness: Secondary | ICD-10-CM | POA: Insufficient documentation

## 2020-11-27 LAB — CBC WITH DIFFERENTIAL/PLATELET
Basophils Absolute: 0.1 10*3/uL (ref 0.0–0.2)
Basos: 1 %
EOS (ABSOLUTE): 0.3 10*3/uL (ref 0.0–0.4)
Eos: 3 %
Hematocrit: 42.3 % (ref 34.0–46.6)
Hemoglobin: 13.7 g/dL (ref 11.1–15.9)
Immature Grans (Abs): 0 10*3/uL (ref 0.0–0.1)
Immature Granulocytes: 0 %
Lymphocytes Absolute: 1.9 10*3/uL (ref 0.7–3.1)
Lymphs: 25 %
MCH: 27.7 pg (ref 26.6–33.0)
MCHC: 32.4 g/dL (ref 31.5–35.7)
MCV: 86 fL (ref 79–97)
Monocytes Absolute: 0.6 10*3/uL (ref 0.1–0.9)
Monocytes: 8 %
Neutrophils Absolute: 4.9 10*3/uL (ref 1.4–7.0)
Neutrophils: 63 %
Platelets: 239 10*3/uL (ref 150–450)
RBC: 4.95 x10E6/uL (ref 3.77–5.28)
RDW: 14.7 % (ref 11.7–15.4)
WBC: 7.8 10*3/uL (ref 3.4–10.8)

## 2020-11-27 LAB — TSH: TSH: 1.89 u[IU]/mL (ref 0.450–4.500)

## 2020-11-27 LAB — COMP. METABOLIC PANEL (12)
AST: 18 IU/L (ref 0–40)
Albumin/Globulin Ratio: 1.8 (ref 1.2–2.2)
Albumin: 4.5 g/dL (ref 3.8–4.8)
Alkaline Phosphatase: 124 IU/L — ABNORMAL HIGH (ref 44–121)
BUN/Creatinine Ratio: 11 — ABNORMAL LOW (ref 12–28)
BUN: 10 mg/dL (ref 8–27)
Bilirubin Total: 0.6 mg/dL (ref 0.0–1.2)
Calcium: 9.6 mg/dL (ref 8.7–10.3)
Chloride: 104 mmol/L (ref 96–106)
Creatinine, Ser: 0.93 mg/dL (ref 0.57–1.00)
Globulin, Total: 2.5 g/dL (ref 1.5–4.5)
Glucose: 86 mg/dL (ref 65–99)
Potassium: 4.4 mmol/L (ref 3.5–5.2)
Sodium: 140 mmol/L (ref 134–144)
Total Protein: 7 g/dL (ref 6.0–8.5)
eGFR: 69 mL/min/{1.73_m2} (ref >59)

## 2020-11-27 LAB — VITAMIN D 25 HYDROXY (VIT D DEFICIENCY, FRACTURES): Vit D, 25-Hydroxy: 30.6 ng/mL (ref 30.0–100.0)

## 2020-11-27 NOTE — Telephone Encounter (Signed)
Patient verified DOB Patient is aware of labs being normal except for slight elevation in liver enzyme. Patient aware of no results addressing the dizziness an recommended to increase water and FU with eye specialist sooner than December appt.

## 2020-11-27 NOTE — Telephone Encounter (Signed)
-----   Message from Roney Jaffe, [place] sent at 11/27/2020  9:38 AM EDT ----- Please call patient and let her know that her kidney and liver function, as well as thyroid function are within normal limits.  She does have 1 liver enzyme that is very slightly elevated, however this is not of concern.  She does not show signs of anemia and her vitamin D level is within normal limits.  None of her labs speak to cause of her dizziness.  I once again strongly encouraged her to follow-up with ophthalmology as soon as possible.

## 2021-01-22 ENCOUNTER — Encounter: Payer: Self-pay | Admitting: Family Medicine

## 2021-01-22 ENCOUNTER — Encounter (INDEPENDENT_AMBULATORY_CARE_PROVIDER_SITE_OTHER): Payer: Medicare (Managed Care) | Admitting: Ophthalmology

## 2021-01-22 DIAGNOSIS — Z87898 Personal history of other specified conditions: Secondary | ICD-10-CM

## 2021-01-24 MED ORDER — FEXOFENADINE HCL 180 MG PO TABS: 180.0000 mg | ORAL_TABLET | ORAL | 1 refills | Status: AC | PRN

## 2021-01-24 NOTE — Telephone Encounter (Signed)
Called patient regarding symptoms described on previous note.  She was evaluated by allergist in 2019 for angioedema.  Daily antihistamine such as Zyrtec, Xyzal or Allegra recommended. Left VM - will try to reach her again later tonight.

## 2021-01-25 NOTE — Telephone Encounter (Signed)
I tried to call patient last night, left message.  Please check status.  If she having any respiratory symptoms, face swelling?  If so needs to be evaluated through urgent care or ER.  If just hives, similar to what she has had in the past, I did send prescription for Allegra but should follow-up in office or with allergist if not improving.  Let me know how she is doing.

## 2021-05-10 ENCOUNTER — Other Ambulatory Visit: Payer: Self-pay | Admitting: Family Medicine

## 2021-05-10 DIAGNOSIS — Z1231 Encounter for screening mammogram for malignant neoplasm of breast: Secondary | ICD-10-CM

## 2021-06-07 ENCOUNTER — Ambulatory Visit: Payer: Medicare (Managed Care)

## 2021-06-07 ENCOUNTER — Other Ambulatory Visit: Payer: Self-pay

## 2022-03-16 IMAGING — MG DIGITAL SCREENING BILAT W/ TOMO W/ CAD
8 series · 8 of 24 positions shown · non-contrast
Comparison: Previous exam(s).

CLINICAL DATA: Screening.

EXAM:
DIGITAL SCREENING BILATERAL MAMMOGRAM WITH TOMO AND CAD

[L CC synth-2D]
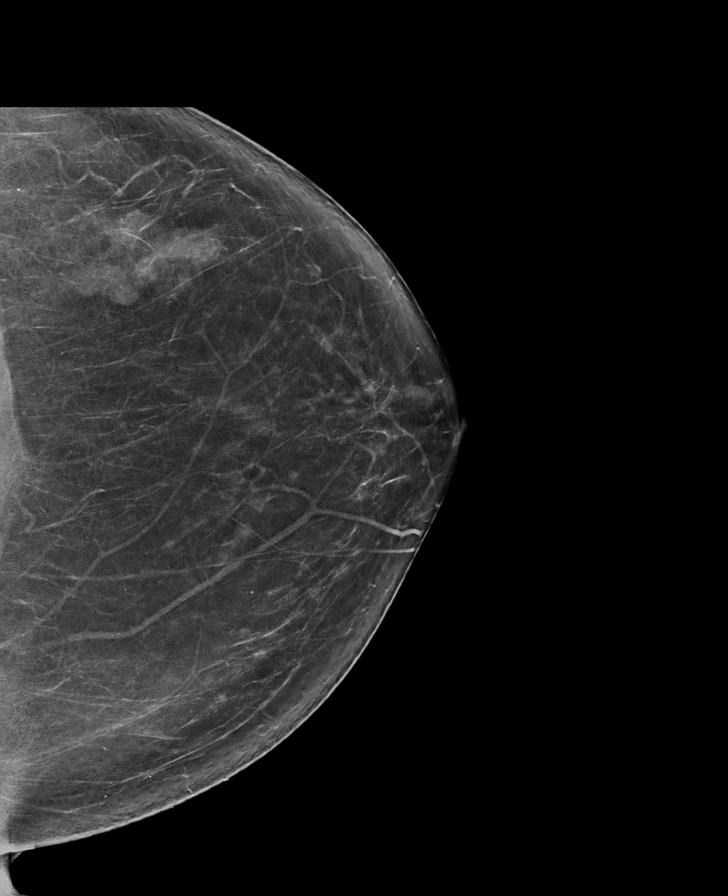

[R CC synth-2D]
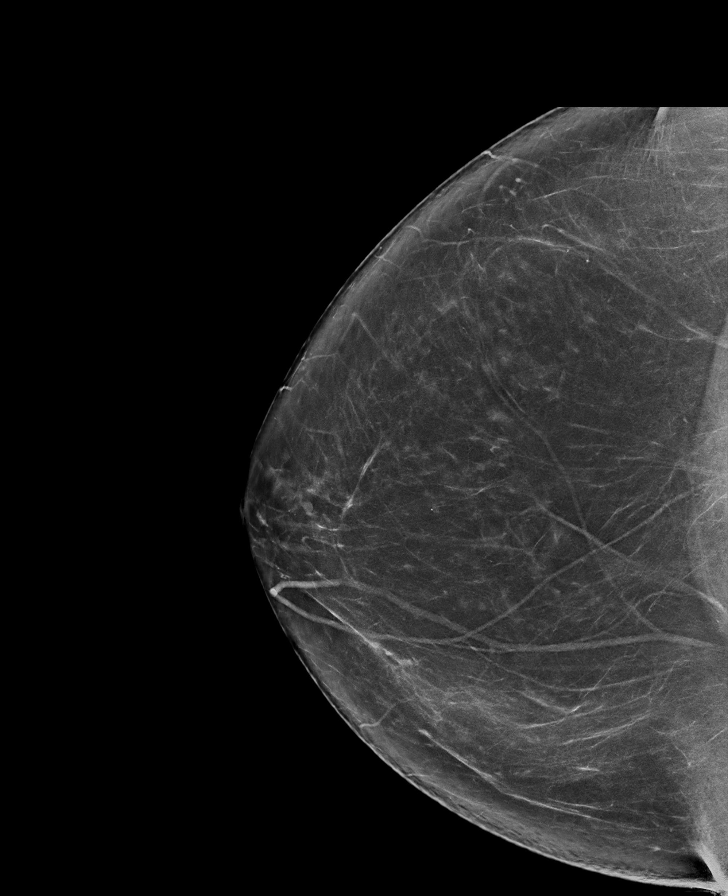

[R MLO synth-2D]
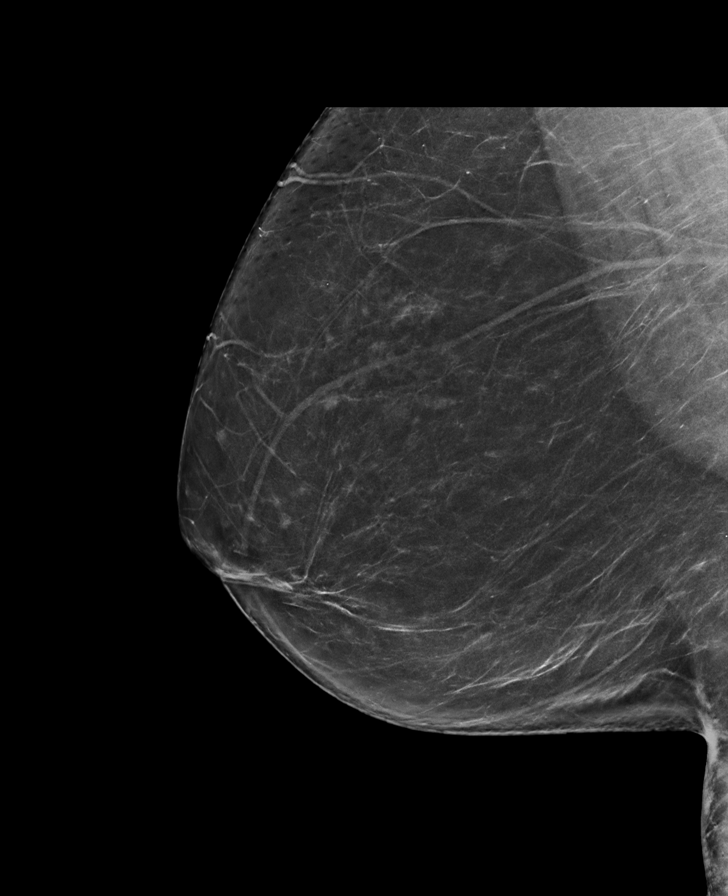

[L MLO synth-2D]
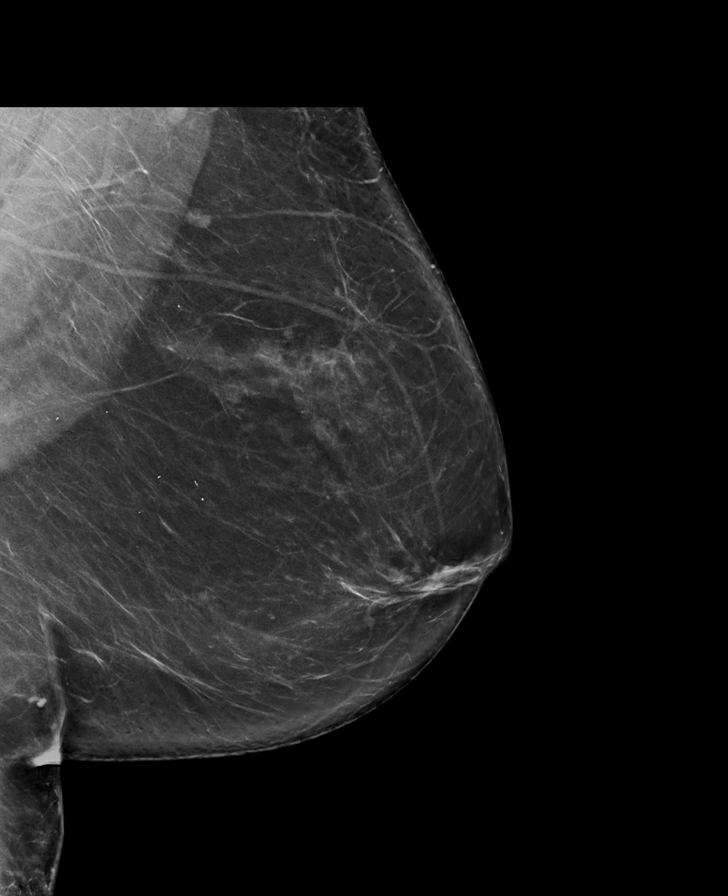

[R MLO tomo · tomo slice 43/86.0]
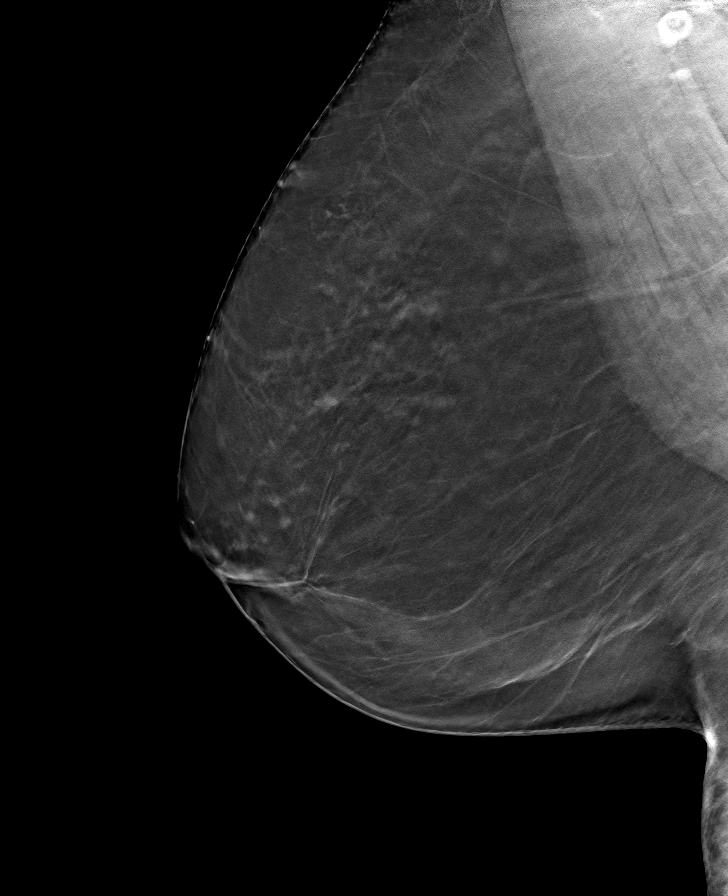

[L MLO tomo · tomo slice 46/91.0]
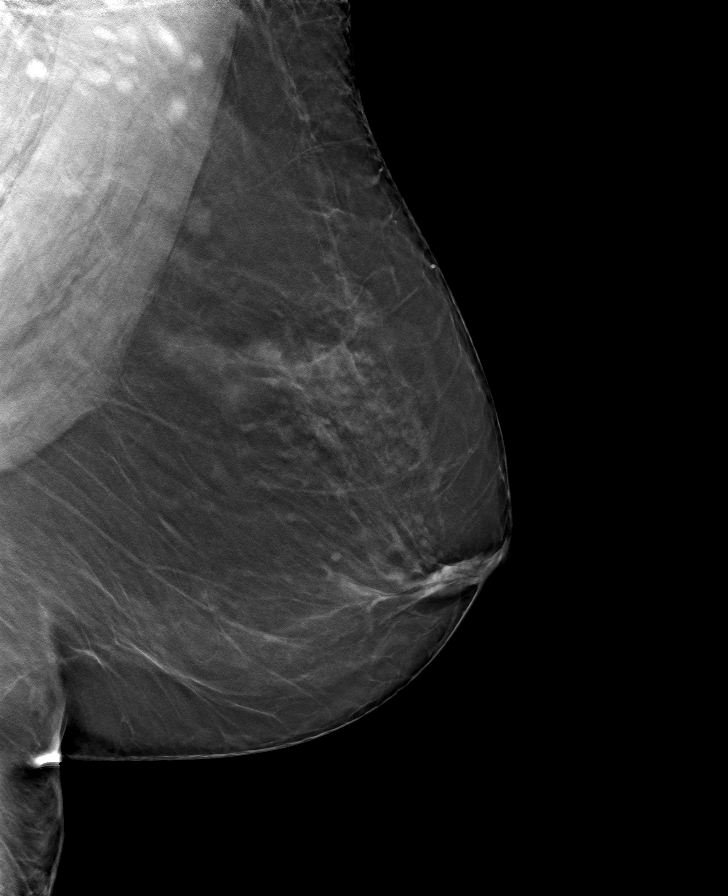

[L CC tomo · tomo slice 41/81.0]
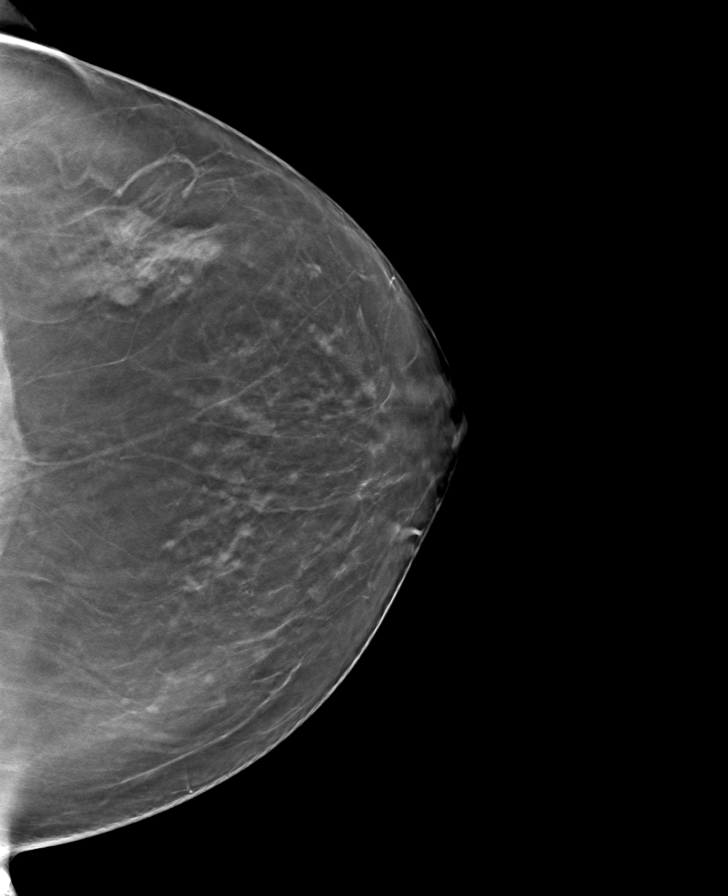

[R CC tomo · tomo slice 43/86.0]
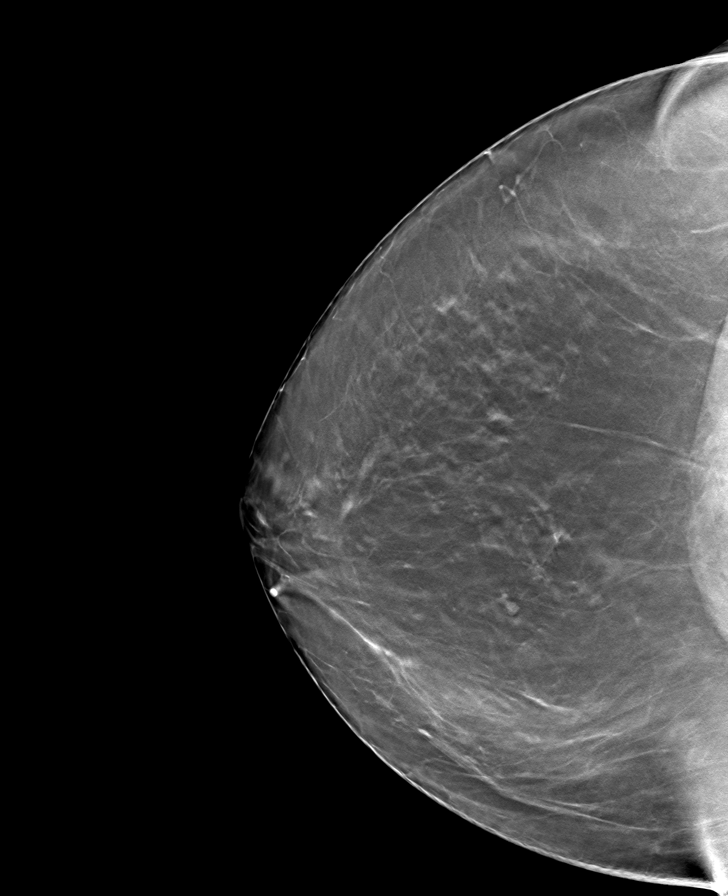

[8 of 24 positions shown; findings below may reference images not displayed]

ACR Breast Density Category b: There are scattered areas of
fibroglandular density.
FINDINGS: There are no findings suspicious for malignancy. Images were
processed with CAD.
IMPRESSION: No mammographic evidence of malignancy. A result letter of this
screening mammogram will be mailed directly to the patient.

RECOMMENDATION:
Screening mammogram in one year. (Code:[id number])

BI-RADS CATEGORY  1: Negative.

## 2022-07-03 ENCOUNTER — Encounter (INDEPENDENT_AMBULATORY_CARE_PROVIDER_SITE_OTHER): Payer: Medicare Other | Admitting: Ophthalmology

## 2022-12-31 LAB — EXTERNAL GENERIC LAB PROCEDURE: COLOGUARD: NEGATIVE

## 2022-12-31 LAB — COLOGUARD: COLOGUARD: NEGATIVE

## 2023-01-11 ENCOUNTER — Ambulatory Visit (HOSPITAL_COMMUNITY)
Admission: EM | Admit: 2023-01-11 | Discharge: 2023-01-11 | Disposition: A | Discharge status: Home / Self Care | Payer: Medicare (Managed Care) | Attending: Emergency Medicine | Admitting: Emergency Medicine

## 2023-01-11 ENCOUNTER — Encounter (HOSPITAL_COMMUNITY): Payer: Self-pay

## 2023-01-11 DIAGNOSIS — J329 Chronic sinusitis, unspecified: Secondary | ICD-10-CM

## 2023-01-11 MED ORDER — METHYLPREDNISOLONE ACETATE 80 MG/ML IJ SUSP
80.0000 mg | Freq: Once | INTRAMUSCULAR | Status: AC
  Administered 2023-01-11: 80 mg via INTRAMUSCULAR

## 2023-01-11 MED ORDER — IPRATROPIUM BROMIDE 0.06 % NA SOLN
2.0000 | Freq: Three times a day (TID) | NASAL | 1 refills | Status: AC
Start: 2023-01-11 — End: ?

## 2023-01-11 MED ORDER — LEVOCETIRIZINE DIHYDROCHLORIDE 5 MG PO TABS: 5.0000 mg | ORAL_TABLET | Freq: Every evening | ORAL | 1 refills | Status: DC

## 2023-01-11 MED ORDER — METHYLPREDNISOLONE ACETATE 80 MG/ML IJ SUSP
INTRAMUSCULAR | Status: AC
  Filled 2023-01-11: qty 1

## 2023-01-11 NOTE — ED Triage Notes (Signed)
Pt reports her ears are clogged. Pt reports ears are ringing x 1 year. Pt reports taking sinus decongestant.

## 2023-01-11 NOTE — Discharge Instructions (Signed)
Your symptoms and my physical exam findings are concerning for exacerbation of your underlying allergies.     Please read below to learn more about the medications, dosages and frequencies that I recommend to help alleviate your symptoms and to get you feeling better soon:  Depo-Medrol IM (methylprednisolone):  To quickly address your significant respiratory inflammation, you were provided with an injection of Solu-Medrol in the office today.  You should continue to feel the full benefit of the steroid for the next 8 to 12 hours.    Xyzal (levocetirizine): This is an excellent second-generation antihistamine that helps to reduce respiratory inflammatory response to environmental allergens.  In some patients, this medication can cause daytime sleepiness so I recommend that you take 1 tablet daily at bedtime.  It appears that your insurance formulary will pay for this antihistamine.  You are also also welcome to continue taking Allegra as well.   Flonase (fluticasone): This is a steroid nasal spray that used once daily, 1 spray in each nare.  This works best when used on a daily basis. This medication does not work well if it is only used when you think you need it.  After 3 to 5 days of use, you will notice significant reduction of the inflammation and mucus production that is currently being caused by exposure to allergens, whether seasonal or environmental.  The most common side effect of this medication is nosebleeds.  If you experience a nosebleed, please discontinue use for 1 week, then feel free to resume.  It appears that your insurance formulary will pay for this nasal steroid spray.  If you find this particular nasal steroid irritating, please consider a different nasal steroids such as Nasonex (mometasone), or Nasacort (triamcinolone).  Nasonex and Nasacort are both available over-the-counter.   Atrovent (ipratropium): This is an excellent nasal decongestant spray that will not cause rebound  congestion.  Please instill 2 sprays into each nare after using the nasal steroid and repeat 2 more times throughout the day.  I have added this nasal spray to the nasal steroid because nasal steroids can take several days to reach full effect.  Once you find that you are forgetting to use the spray more often than you remember to use it, you will know that you no longer need it.     If you find that your health insurance will not pay for allergy medications, please consider downloading the GoodRx app and using to get a better price than the "off the shelf" price.     If symptoms have not meaningfully improved in the next 5 to 7 days, please return for repeat evaluation or follow-up with your regular provider.  If symptoms have worsened in the next 3 to 5 days, please return for repeat evaluation or follow-up with your regular provider.    Thank you for visiting urgent care today.  We appreciate the opportunity to participate in your care.

## 2023-01-11 NOTE — ED Provider Notes (Addendum)
[facility]    CSN: [id number] Arrival date & time: 01/11/23  1604    HISTORY   Chief Complaint  Patient presents with   Ear Pain   Tinnitus   Dental Pain   HPI Misty Burke is a pleasant, 67 y.o. female who presents to urgent care today. Patient complains of tinnitus and ears feeling clogged.  Patient states this has been going on for over a year.  Patient states she has been taking a lipo supplement over-the-counter that is supposed to help with tinnitus, using eardrops available over-the-counter for tinnitus as well as Allegra and fluticasone nasal steroid spray.  Patient states she is not taking Allegra and the Flonase daily.  Patient states she also has been having sinus pressure for the past several days.  Patient denies fever, body aches, chills, purulent nasal drainage, cough, sore throat.  The history is provided by the patient.   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    Home Medications    Prior to Admission medications    Medication Sig Start Date End Date Taking? Authorizing Provider  Ascorbic Acid (VITAMIN C) 1000 MG tablet Take 1,000 mg by mouth daily.   Yes [provider]  B Complex Vitamins (B COMPLEX 1 PO) Take by mouth.   Yes [provider]  cholecalciferol (VITAMIN D3) 25 MCG (1000 UT) tablet Take 1,000 Units by mouth daily.   Yes [provider]  fexofenadine (ALLEGRA) 180 MG tablet Take 1 tablet (180 mg total) by mouth as needed. 01/24/21  Yes Shade Flood, MD  fluticasone [facility]) 50 MCG/ACT nasal spray PLACE 1 TO 2 SPRAYS INTO BOTH NOSTRILS AT BEDTIME 04/20/19  Yes Shade Flood, MD  ipratropium (ATROVENT) 0.06 % nasal spray Place 2 sprays into both nostrils 3 (three) times daily. As needed for nasal congestion, runny nose 01/11/23  Yes Theadora Rama Scales, PA-C  levocetirizine (XYZAL) 5 MG tablet Take 1 tablet (5 mg total) by mouth every evening. 01/11/23 07/10/23 Yes Theadora Rama Scales, PA-C  Multiple Vitamin (MULTIVITAMIN) tablet Take 1 tablet by mouth daily.   Yes [provider]  omeprazole (PRILOSEC) 20 MG capsule TAKE 1 CAPSULE BY MOUTH EVERY DAY 07/18/19  Yes Shade Flood, MD    Family History Family History  Problem Relation Age of Onset   Cancer Other    Cancer Father    Diabetes Neg Hx    Hypertension Neg Hx  Breast cancer Neg Hx    Colon cancer Neg Hx    Esophageal cancer Neg Hx    Rectal cancer Neg Hx    Stomach cancer Neg Hx    Colon polyps Neg Hx    Social History Social History   Tobacco Use   Smoking status: Never   Smokeless tobacco: Never  Vaping Use   Vaping Use: Never used  Substance Use Topics   Alcohol use: Yes    Comment: socially    Drug use: No   Allergies   Patient has no known allergies.  Review of Systems Review of Systems Pertinent findings revealed after performing a 14 point review of systems has been noted in the history of present illness.  Physical Exam Vital Signs BP 126/75 (BP  Location: Left Arm)   Pulse 67   Temp 98.4 F (36.9 C) (Oral)   Resp 18   LMP  (LMP Unknown)   SpO2 97%   No data found.  Physical Exam Vitals and nursing note reviewed.  Constitutional:      General: She is not in acute distress.    Appearance: Normal appearance. She is not ill-appearing.  HENT:     Head: Normocephalic and atraumatic.     Salivary Glands: Right salivary gland is not diffusely enlarged or tender. Left salivary gland is not diffusely enlarged or tender.     Right Ear: Ear canal and external ear normal. No drainage. A middle ear effusion is present. There is no impacted cerumen. Tympanic membrane is bulging. Tympanic membrane is not injected or erythematous.     Left Ear: Ear canal and external ear normal. No drainage. A middle ear effusion is present. There is no impacted cerumen. Tympanic membrane is bulging. Tympanic membrane is not injected or erythematous.     Ears:     Comments: Bilateral EACs normal, both TMs bulging with clear fluid    Nose: Rhinorrhea present. No nasal deformity, septal deviation, signs of injury, nasal tenderness, mucosal edema or congestion. Rhinorrhea is clear.     Right Nostril: Occlusion present. No foreign body, epistaxis or septal hematoma.     Left Nostril: Occlusion present. No foreign body, epistaxis or septal hematoma.     Right Turbinates: Enlarged, swollen and pale.     Left Turbinates: Enlarged, swollen and pale.     Right Sinus: No maxillary sinus tenderness or frontal sinus tenderness.     Left Sinus: No maxillary sinus tenderness or frontal sinus tenderness.     Mouth/Throat:     Lips: Pink. No lesions.     Mouth: Mucous membranes are moist. No oral lesions.     Pharynx: Oropharynx is clear. Uvula midline. No posterior oropharyngeal erythema or uvula swelling.     Tonsils: No tonsillar exudate. 0 on the right. 0 on the left.     Comments: Postnasal drip Eyes:     General: Lids are normal.        Right eye: No discharge.         Left eye: No discharge.     Extraocular Movements: Extraocular movements intact.     Conjunctiva/sclera: Conjunctivae normal.     Right eye: Right conjunctiva is not injected.     Left eye: Left conjunctiva is not injected.  Neck:     Trachea: Trachea and phonation normal.  Cardiovascular:     Rate and Rhythm: Normal rate and regular rhythm.     Pulses: Normal pulses.     Heart sounds:  Normal heart sounds. No murmur heard.    No friction rub. No gallop.  Pulmonary:     Effort: Pulmonary effort is normal. No accessory muscle usage, prolonged expiration or respiratory distress.     Breath sounds: Normal breath sounds. No stridor, decreased air movement or transmitted upper airway sounds. No decreased breath sounds, wheezing, rhonchi or rales.  Chest:     Chest wall: No tenderness.  Musculoskeletal:        General: Normal range of motion.     Cervical back: Normal range of motion and neck supple. Normal range of motion.  Lymphadenopathy:     Cervical: No cervical adenopathy.  Skin:    General: Skin is warm and dry.     Findings: No erythema or rash.  Neurological:     General: No focal deficit present.     Mental Status: She is alert and oriented to person, place, and time.  Psychiatric:        Mood and Affect: Mood normal.        Behavior: Behavior normal.     Visual Acuity Right Eye Distance:   Left Eye Distance:   Bilateral Distance:    Right Eye Near:   Left Eye Near:    Bilateral Near:     UC Couse / Diagnostics / Procedures:     Radiology No results found.  Procedures Procedures (including critical care time) EKG  Pending results:  Labs Reviewed - No data to display  Medications Ordered in UC: Medications  methylPREDNISolone acetate (DEPO-MEDROL) injection 80 mg (has no administration in time range)    UC Diagnoses / Final Clinical Impressions(s)   I have reviewed the triage vital signs and the nursing notes.  Pertinent labs & imaging results  that were available during my care of the patient were reviewed by me and considered in my medical decision making (see chart for details).    Final diagnoses:  Rhinosinusitis   Patient advised physical exam findings are concerning for uncontrolled upper respiratory allergies.  Patient provided with an injection of Depo-Medrol for rapid relief of inflammatory symptoms.  Patient provided with prescriptions for levocetirizine (because it appears that her insurance pays for this) and advised that she can continue Allegra until it is complete.  Patient also provided with ipratropium nasal spray that she can continue along with her Flonase.  Patient advised to use all of these daily.  Patient advised to follow-up with ENT if no improvement after 5 to 7 days.  Please see discharge instructions below for details of plan of care as provided to patient. ED Prescriptions     Medication Sig Dispense Auth. Provider   levocetirizine (XYZAL) 5 MG tablet Take 1 tablet (5 mg total) by mouth every evening. 90 tablet Theadora Rama Scales, PA-C   ipratropium (ATROVENT) 0.06 % nasal spray Place 2 sprays into both nostrils 3 (three) times daily. As needed for nasal congestion, runny nose 15 mL Theadora Rama Scales, PA-C      PDMP not reviewed this encounter.  Pending results:  Labs Reviewed - No data to display  Discharge Instructions:   Discharge Instructions      Your symptoms and my physical exam findings are concerning for exacerbation of your underlying allergies.     Please read below to learn more about the medications, dosages and frequencies that I recommend to help alleviate your symptoms and to get you feeling better soon:  Depo-Medrol IM (methylprednisolone):  To quickly address your significant respiratory inflammation,  you were provided with an injection of Solu-Medrol in the office today.  You should continue to feel the full benefit of the steroid for the next 8 to 12 hours.    Xyzal  (levocetirizine): This is an excellent second-generation antihistamine that helps to reduce respiratory inflammatory response to environmental allergens.  In some patients, this medication can cause daytime sleepiness so I recommend that you take 1 tablet daily at bedtime.  It appears that your insurance formulary will pay for this antihistamine.  You are also also welcome to continue taking Allegra as well.   Flonase (fluticasone): This is a steroid nasal spray that used once daily, 1 spray in each nare.  This works best when used on a daily basis. This medication does not work well if it is only used when you think you need it.  After 3 to 5 days of use, you will notice significant reduction of the inflammation and mucus production that is currently being caused by exposure to allergens, whether seasonal or environmental.  The most common side effect of this medication is nosebleeds.  If you experience a nosebleed, please discontinue use for 1 week, then feel free to resume.  It appears that your insurance formulary will pay for this nasal steroid spray.  If you find this particular nasal steroid irritating, please consider a different nasal steroids such as Nasonex (mometasone), or Nasacort (triamcinolone).  Nasonex and Nasacort are both available over-the-counter.   Atrovent (ipratropium): This is an excellent nasal decongestant spray that will not cause rebound congestion.  Please instill 2 sprays into each nare after using the nasal steroid and repeat 2 more times throughout the day.  I have added this nasal spray to the nasal steroid because nasal steroids can take several days to reach full effect.  Once you find that you are forgetting to use the spray more often than you remember to use it, you will know that you no longer need it.     If you find that your health insurance will not pay for allergy medications, please consider downloading the GoodRx app and using to get a better price than the "off  the shelf" price.     If symptoms have not meaningfully improved in the next 5 to 7 days, please return for repeat evaluation or follow-up with your regular provider.  If symptoms have worsened in the next 3 to 5 days, please return for repeat evaluation or follow-up with your regular provider.    Thank you for visiting urgent care today.  We appreciate the opportunity to participate in your care.     Disposition Upon Discharge:  Condition: stable for discharge home  Patient presented with an acute illness with associated systemic symptoms and significant discomfort requiring urgent management. In my opinion, this is a condition that a prudent lay person (someone who possesses an average knowledge of health and medicine) may potentially expect to result in complications if not addressed urgently such as respiratory distress, impairment of bodily function or dysfunction of bodily organs.   Routine symptom specific, illness specific and/or disease specific instructions were discussed with the patient and/or caregiver at length.   As such, the patient has been evaluated and assessed, work-up was performed and treatment was provided in alignment with urgent care protocols and evidence based medicine.  Patient/parent/caregiver has been advised that the patient may require follow up for further testing and treatment if the symptoms continue in spite of treatment, as clinically indicated and appropriate.  Patient/parent/caregiver has been advised to return to the [facility] or PCP if no better; to PCP or the Emergency Department if new signs and symptoms develop, or if the current signs or symptoms continue to change or worsen for further workup, evaluation and treatment as clinically indicated and appropriate  The patient will follow up with their current PCP if and as advised. If the patient does not currently have a PCP we will assist them in obtaining one.   The patient may need specialty follow up if  the symptoms continue, in spite of conservative treatment and management, for further workup, evaluation, consultation and treatment as clinically indicated and appropriate.  Patient/parent/caregiver verbalized understanding and agreement of plan as discussed.  All questions were addressed during visit.  Please see discharge instructions below for further details of plan.  This office note has been dictated using Teaching laboratory technician.  Unfortunately, this method of dictation can sometimes lead to typographical or grammatical errors.  I apologize for your inconvenience in advance if this occurs.  Please do not hesitate to reach out to me if clarification is needed.      Theadora Rama Scales, PA-C 01/11/23 1653    Theadora Rama Scales, PA-C 01/11/23 1654

## 2023-07-18 ENCOUNTER — Ambulatory Visit (HOSPITAL_COMMUNITY)
Admission: EM | Admit: 2023-07-18 | Discharge: 2023-07-18 | Disposition: A | Discharge status: Home / Self Care | Payer: Medicare (Managed Care) | Attending: Family Medicine | Admitting: Family Medicine

## 2023-07-18 ENCOUNTER — Emergency Department (HOSPITAL_COMMUNITY)
Admission: EM | Admit: 2023-07-18 | Discharge: 2023-07-18 | Disposition: A | Discharge status: Home / Self Care | Payer: Medicare (Managed Care) | Attending: Emergency Medicine | Admitting: Emergency Medicine

## 2023-07-18 ENCOUNTER — Emergency Department (HOSPITAL_COMMUNITY): Payer: Medicare (Managed Care)

## 2023-07-18 ENCOUNTER — Encounter (HOSPITAL_COMMUNITY): Payer: Self-pay | Admitting: Emergency Medicine

## 2023-07-18 DIAGNOSIS — R7303 Prediabetes: Secondary | ICD-10-CM | POA: Diagnosis not present

## 2023-07-18 DIAGNOSIS — H43392 Other vitreous opacities, left eye: Secondary | ICD-10-CM | POA: Insufficient documentation

## 2023-07-18 DIAGNOSIS — H5462 Unqualified visual loss, left eye, normal vision right eye: Secondary | ICD-10-CM

## 2023-07-18 LAB — COMPREHENSIVE METABOLIC PANEL
ALT: 18 U/L (ref 0–44)
AST: 19 U/L (ref 15–41)
Albumin: 3.9 g/dL (ref 3.5–5.0)
Alkaline Phosphatase: 88 U/L (ref 38–126)
Anion gap: 8 (ref 5–15)
BUN: 13 mg/dL (ref 8–23)
CO2: 25 mmol/L (ref 22–32)
Calcium: 9.3 mg/dL (ref 8.9–10.3)
Chloride: 105 mmol/L (ref 98–111)
Creatinine, Ser: 1.02 mg/dL — ABNORMAL HIGH (ref 0.44–1.00)
GFR, Estimated: >60 mL/min (ref >60)
Glucose, Bld: 87 mg/dL (ref 70–99)
Potassium: 4.7 mmol/L (ref 3.5–5.1)
Sodium: 138 mmol/L (ref 135–145)
Total Bilirubin: 0.8 mg/dL (ref <1.2)
Total Protein: 6.9 g/dL (ref 6.5–8.1)

## 2023-07-18 LAB — CBC
HCT: 42.2 % (ref 36.0–46.0)
Hemoglobin: 13.5 g/dL (ref 12.0–15.0)
MCH: 28 pg (ref 26.0–34.0)
MCHC: 32 g/dL (ref 30.0–36.0)
MCV: 87.6 fL (ref 80.0–100.0)
Platelets: 230 10*3/uL (ref 150–400)
RBC: 4.82 MIL/uL (ref 3.87–5.11)
RDW: 14.6 % (ref 11.5–15.5)
WBC: 11 10*3/uL — ABNORMAL HIGH (ref 4.0–10.5)
nRBC: 0 % (ref 0.0–0.2)

## 2023-07-18 LAB — ETHANOL: Alcohol, Ethyl (B): <10 mg/dL (ref <10)

## 2023-07-18 LAB — DIFFERENTIAL
Abs Immature Granulocytes: 0.03 10*3/uL (ref 0.00–0.07)
Basophils Absolute: 0.1 10*3/uL (ref 0.0–0.1)
Basophils Relative: 1 %
Eosinophils Absolute: 0.3 10*3/uL (ref 0.0–0.5)
Eosinophils Relative: 2 %
Immature Granulocytes: 0 %
Lymphocytes Relative: 17 %
Lymphs Abs: 1.9 10*3/uL (ref 0.7–4.0)
Monocytes Absolute: 0.7 10*3/uL (ref 0.1–1.0)
Monocytes Relative: 6 %
Neutro Abs: 8 10*3/uL — ABNORMAL HIGH (ref 1.7–7.7)
Neutrophils Relative %: 74 %

## 2023-07-18 LAB — APTT: aPTT: 34 s (ref 24–36)

## 2023-07-18 LAB — PROTIME-INR
INR: 1 (ref 0.8–1.2)
Prothrombin Time: 13.6 s (ref 11.4–15.2)

## 2023-07-18 LAB — POCT FASTING CBG KUC MANUAL ENTRY: POCT Glucose (KUC): 143 mg/dL — AB (ref 70–99)

## 2023-07-18 MED ORDER — TETRACAINE HCL 0.5 % OP SOLN
2.0000 [drp] | Freq: Once | OPHTHALMIC | Status: AC
  Administered 2023-07-18: 2 [drp] via OPHTHALMIC
  Filled 2023-07-18: qty 4

## 2023-07-18 NOTE — ED Provider Triage Note (Signed)
Emergency Medicine Provider Triage Evaluation Note  Misty Burke , a 67 y.o. female  was evaluated in triage.  Pt complains of visual changes. A week ago pt was at a funeral when she developed sudden onset of visual loss to both eyes lasting transiently.  Since then she notice spot in her L eye and blurry vision to L eye that have been persistent.  No headache, focal numbness, focal weakness, or confusion.  No prior stroke  Review of Systems  Positive: As above Negative: As above  Physical Exam  BP (!) 163/91 (BP Location: Right Arm)   Pulse 77   Temp 97.7 F (36.5 C) (Oral)   Resp 18   LMP  (LMP Unknown)   SpO2 98%  Gen:   Awake, no distress   Resp:  Normal effort  MSK:   Moves extremities without difficulty  Other:    Medical Decision Making  Medically screening exam initiated at 4:25 PM.  Appropriate orders placed.  Misty Burke was informed that the remainder of the evaluation will be completed by another provider, this initial triage assessment does not replace that evaluation, and the importance of remaining in the ED until their evaluation is complete.     Fayrene Helper, PA-C 07/18/23 1626

## 2023-07-18 NOTE — ED Provider Notes (Signed)
[facility]    CSN: [id number] Arrival date & time: 07/18/23  1431      History   Chief Complaint Chief Complaint  Patient presents with   Eye Problem    HPI Misty Burke is a 67 y.o. female.    Eye Problem Here for decreased vision in left eye.  On 12/14, she took her first dose of amitryptyline ever. The next day, 12/15, she was at someone's funeral when she had sudden decrease in her vision of the left eye. It maybe improved a little, but she still has some blurriness, a floater, and sensitivity to light on the left eye.  Her right eye vision is stable, with decreased vision in the right upper eye.  She was established with ophthalmology, but had not seen them in awhile, and now that ophthalmologist does not take her current insurance.  She waited to call the ophthalmologist till 12/20.     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      Home Medications    Prior to Admission medications   Medication Sig Start Date End Date  Taking? Authorizing Provider  Ascorbic Acid (VITAMIN C) 1000 MG tablet Take 1,000 mg by mouth daily.    [provider]  B Complex Vitamins (B COMPLEX 1 PO) Take by mouth.    [provider]  cholecalciferol (VITAMIN D3) 25 MCG (1000 UT) tablet Take 1,000 Units by mouth daily.    [provider]  fexofenadine (ALLEGRA) 180 MG tablet Take 1 tablet (180 mg total) by mouth as needed. 01/24/21   Shade Flood, MD  fluticasone (FLONASE) 50 MCG/ACT nasal spray PLACE 1 TO 2 SPRAYS INTO BOTH NOSTRILS AT BEDTIME 04/20/19   Shade Flood, MD  ipratropium (ATROVENT) 0.06 % nasal spray Place 2 sprays into both nostrils 3 (three) times daily. As needed for nasal congestion, runny nose Patient not taking: Reported on 07/18/2023 01/11/23   Theadora Rama Scales, PA-C  levocetirizine (XYZAL) 5 MG tablet Take 1 tablet (5 mg total) by mouth every evening. Patient not taking: Reported on 07/18/2023 01/11/23 07/10/23  Theadora Rama Scales, PA-C  Multiple Vitamin (MULTIVITAMIN) tablet Take 1 tablet by mouth daily.    [provider]  omeprazole (PRILOSEC) 20 MG capsule TAKE 1 CAPSULE BY MOUTH EVERY DAY Patient not taking: Reported on 07/18/2023 07/18/19   Shade Flood, MD    Family History Family History  Problem Relation Age of Onset   Cancer Other    Cancer Father    Diabetes Neg Hx    Hypertension Neg Hx    Breast cancer Neg Hx    Colon cancer Neg Hx    Esophageal cancer Neg Hx    Rectal cancer Neg Hx    Stomach cancer Neg Hx    Colon polyps Neg Hx     Social History Social History   Tobacco Use   Smoking status: Never   Smokeless tobacco: Never  Vaping Use   Vaping status: Never Used  Substance Use Topics   Alcohol use: Yes    Comment: socially    Drug use: No     Allergies   Patient has no known allergies.   Review of Systems Review of Systems   Physical Exam Triage Vital Signs ED Triage Vitals  Encounter Vitals Group     BP  07/18/23 1452 105/72     Systolic BP Percentile --      Diastolic BP Percentile --      Pulse Rate 07/18/23 1452 76     Resp 07/18/23 1452 16     Temp 07/18/23 1452 97.9 F (36.6 C)     Temp Source 07/18/23 1452 Oral     SpO2 07/18/23 1452 97 %     Weight --      Height --      Head Circumference --      Peak Flow --      Pain Score 07/18/23 1448 0     Pain Loc --      Pain Education --      Exclude from Growth Chart --    No data found.  Updated Vital Signs BP 105/72 (BP Location: Right Arm)   Pulse 76   Temp 97.9 F (36.6 C) (Oral)   Resp 16   LMP  (LMP Unknown)   SpO2 97%   Visual Acuity Right Eye Distance:   Left Eye Distance:   Bilateral Distance:    Right Eye Near:   Left Eye Near:    Bilateral Near:     Physical Exam Vitals reviewed.  Constitutional:      General: She is not in acute distress.    Appearance: She is not ill-appearing, toxic-appearing or diaphoretic.  Eyes:     Conjunctiva/sclera: Conjunctivae normal.     Pupils: Pupils are equal, round, and reactive to light.     Comments: No injection of either conjunctivae, nor swelling of the lids or orbits  Skin:    Coloration: Skin is not pale.  Neurological:     General: No focal deficit present.     Mental Status: She is alert and oriented to person, place, and time.      UC Treatments / Results  Labs (all labs ordered are listed, but only abnormal results are displayed) Labs Reviewed  POCT FASTING [facility] MANUAL ENTRY - Abnormal; Notable for the following components:      Result Value   POCT Glucose ([facility]) 143 (*)    All other components within normal limits    EKG   Radiology No results found.  Procedures Procedures (including critical care time)  Medications Ordered in UC Medications - No data to display  Initial Impression / Assessment and Plan / UC Course  I have reviewed the triage vital signs and the nursing notes.  Pertinent labs & imaging results that were available  during my care of the patient  were reviewed by me and considered in my medical decision making (see chart for details).     Sugar 143, most likely not causing her symptoms. I have asked her to proceed to the ER for further evaluation, as we do not have slit lamp exam capability here. She is given contact info for ophthalmology, and also instruction on how to set up pcp appt Final Clinical Impressions(s) / UC Diagnoses   Final diagnoses:  Vision loss, left eye     Discharge Instructions      Your sugar was 143; a little elevated, but not enough to be causing your symptoms.  Please proceed to the ER for further evaluation that we cannot provide for you in the urgent care clinic setting.   You can use the QR code/website at the back of the summary paperwork to schedule yourself a new patient appointment with primary care      ED Prescriptions   None    PDMP not reviewed this encounter.   Zenia Resides, MD 07/18/23 [phone]

## 2023-07-18 NOTE — ED Triage Notes (Signed)
Pt states that on Sunday she was at a funeral and suddenly had loss of vision in her L eye, which came back quickly after drinking water. Pt states that since that time she's seen "floaters" in her L eye. Denies headache. No known trauma. No unilateral weakness noted during triage.

## 2023-07-18 NOTE — Discharge Instructions (Signed)
Your sugar was 143; a little elevated, but not enough to be causing your symptoms.  Please proceed to the ER for further evaluation that we cannot provide for you in the urgent care clinic setting.   You can use the QR code/website at the back of the summary paperwork to schedule yourself a new patient appointment with primary care

## 2023-07-18 NOTE — Discharge Instructions (Addendum)
You have been seen here in the emergency department for eye problem. We have obtained a full history, performed a physical exam, in addition to other diagnostic tests and treatments. Right now, we feel that you are safe for discharge from a medical perspective, and do not have an acute life threatening illness.   To do: 1.) Take all medications as prescribed.   2.) If anything changes, or you develop fevers, chills, inability to eat or drink, severe pain, new symptoms, return of symptoms, worsening of symptoms, or any other concerns, please call 911 or come back to the emergency department as soon as possible.   3.) Please make an appointment with your primary care doctor for a follow-up visit after being seen here in the emergency department. If you do not have a primary care doctor, you can call [phone] for assistance in finding one or your health care insurance company.    4.)  I performed an eye ultrasound, your vision is intact although you are having some floaters, this could be a vitreous hemorrhage, you need to be seen by ophthalmology, please call them to make an appointment.  Before an ultrasound that looks good.  We performed a CT head and labs and they are unremarkable at this point  YOU NEED TO GO SEE OPHTHO ON MONDAY AT 9AM    Thank you for allowing me to take care of you today. We hope that you feel better soon.

## 2023-07-18 NOTE — ED Triage Notes (Signed)
Pt was seen recently by MD and given Amitriptyline 10 mg she took that for the first time Saturday. States Sunday she had complete loss of vision which came back shortly. She is having slight vision blurriness, floater, and sensitivity to light.

## 2023-07-18 NOTE — ED Notes (Signed)
Patient is being discharged from the Urgent Care and sent to the Emergency Department via private vehicle . Per provider, patient is in need of higher level of care due to loss of vision, floaters. Patient is aware and verbalizes understanding of plan of care.  Vitals:   07/18/23 1452  BP: 105/72  Pulse: 76  Resp: 16  Temp: 97.9 F (36.6 C)  SpO2: 97%

## 2023-07-18 NOTE — ED Provider Notes (Signed)
[place] EMERGENCY DEPARTMENT AT [facility] Provider Note  HPI   Misty Burke is a 67 y.o. female patient with a PMHx of prediabetes, history of posterior vitreous detachment, here today with concern for left eye floaters.  Patient states that she saw a dermatologist last week, was prescribed amitriptyline, and took a dose this past Saturday about a week ago.  She states that 6 days ago on Sunday she was at a funeral, had decreased vision in her left eye that went away suddenly and then suddenly resolved after drink some water.  Since then she has had some intermittent floaters in her left eye.  Denies headache no known trauma no weakness no fevers.   ROS Negative except as per HPI   Medical Decision Making   Upon presentation, the patient is afebrile hemodynamically stable neurovascular intact normal neurologic exam hemodynamics are stable.  Prior to me seeing the patient large laboratory workup was done, she has a creatinine of 1.02, but really no other  major derangements seen on her hematologic negative coags  or metabolic panel negative ethanol and a CT had already been ordered which were also negative for any acute process.  Is a large workup has been done.  I performed a detailed eye exam, there is no conjunctival injection, pupils are symmetric equal and reactive about 3 mm bilaterally, she has extraocular movements are intact, her vision is 20/20 bilaterally, and I also performed an eye ultrasound which showed no evidence of retinal detachment, no evidence of intra ocular pressure increase, no evidence of vitreous detachment or hemorrhage,   Pressures 20/20 in both eyes, I reached out to ophthalmology, they believe this is posterior vitreous in nature, and but we will see her on Monday morning at the clinic do not think any additional workup is needed.  We discharged the patient at this time.  Pt did have vitreal detachment on the right eye in the past, and previously  followed with ophthalmology, however no longer does due to insurance reason.  At this time, I feel that the patient is medically cleared for discharge and have discussed this with my attending who agrees.  I discussed with the patient and/or family my overall assessment, including my physical exam, labs, imaging, other diagnostic tests, and therapeutics given.  All questions answered and understanding is expressed.  I have instructed to call PCP to establish an outpatient appointment after this ED visit, and necessary specialty follow up if needed. I gave strict return precautions to come back to the ED including fevers, chills, severe pain, worsening of symptoms, return of symptoms, new and concerning symptoms, inability to tolerate p.o. intake, among others. I specifically stated to return if symptoms worsen or return.    1. Vitreous floaters of left eye     @DISPOSITION @  Rx / DC Orders ED Discharge Orders     None        Past Medical History:  Diagnosis Date   Allergy    Angio-edema    Anxiety    Depression    situational after death of her mother    GERD (gastroesophageal reflux disease)    Sleep apnea    no CPAP   Urticaria    Past Surgical History:  Procedure Laterality Date   BREAST CYST ASPIRATION Left    COLONOSCOPY     UPPER GASTROINTESTINAL ENDOSCOPY     Family History  Problem Relation Age of Onset   Cancer Other    Cancer Father  Diabetes Neg Hx    Hypertension Neg Hx    Breast cancer Neg Hx    Colon cancer Neg Hx    Esophageal cancer Neg Hx    Rectal cancer Neg Hx    Stomach cancer Neg Hx    Colon polyps Neg Hx    Social History   Socioeconomic History   Marital status: Unknown    Spouse name: Not on file   Number of children: Not on file   Years of education: Not on file   Highest education level: Not on file  Occupational History   Not on file  Tobacco Use   Smoking status: Never   Smokeless tobacco: Never  Vaping Use   Vaping  status: Never Used  Substance and Sexual Activity   Alcohol use: Yes    Comment: socially    Drug use: No   Sexual activity: Not Currently  Other Topics Concern   Not on file  Social History Narrative   Not on file   Social Drivers of Health   Financial Resource Strain: Not on file  Food Insecurity: Not on file  Transportation Needs: Not on file  Physical Activity: Not on file  Stress: Not on file  Social Connections: Unknown (12/10/2021)   Received from [facility], [facility]   Social Network    Social Network: Not on file  Intimate Partner Violence: Unknown (11/01/2021)   Received from [facility], [facility]   HITS    Physically Hurt: Not on file    Insult or Talk Down To: Not on file    Threaten Physical Harm: Not on file    Scream or Curse: Not on file     Physical Exam   Vitals:   07/18/23 1540 07/18/23 1830 07/18/23 1845 07/18/23 1945  BP: (!) 163/91 130/75 121/71 126/83  Pulse: 77 68 66 65  Resp: 18  17 16   Temp: 97.7 F (36.5 C)     TempSrc: Oral     SpO2: 98% 100% 100% 100%    Physical Exam Vitals and nursing note reviewed.  Constitutional:      General: She is not in acute distress.    Appearance: She is well-developed.  HENT:     Head: Normocephalic and atraumatic.  Eyes:     General: No scleral icterus.       Right eye: No discharge.        Left eye: No discharge.     Extraocular Movements: Extraocular movements intact.     Conjunctiva/sclera: Conjunctivae normal.     Pupils: Pupils are equal, round, and reactive to light.     Comments:  there is no conjunctival injection, pupils are symmetric equal and reactive about 3 mm bilaterally, she has extraocular movements are intact, her vision is 20/20 bilaterally,   Cardiovascular:     Rate and Rhythm: Normal rate and regular rhythm.     Heart sounds: No murmur heard. Pulmonary:     Effort: Pulmonary effort is normal. No respiratory distress.     Breath sounds: Normal breath sounds.   Abdominal:     Palpations: Abdomen is soft.     Tenderness: There is no abdominal tenderness.  Musculoskeletal:        General: No swelling.     Cervical back: Neck supple.  Skin:    General: Skin is warm and dry.     Capillary Refill: Capillary refill takes less than 2 seconds.  Neurological:     General: No  focal deficit present.     Mental Status: She is alert and oriented to person, place, and time. Mental status is at baseline.     Comments: Moving all fours, strength is intact in all 4 extremities, sensation intact in all 4 extremities, cranial nerves II through XII are intact, no facial asymmetry no dysarthria no aphasia  Psychiatric:        Mood and Affect: Mood normal.      Procedures   If procedures were preformed on this patient, they are listed below:  Procedures  The patient was seen, evaluated, and treated in conjunction with the attending physician, who voiced agreement in the care provided.  Note generated using Dragon voice dictation software and may contain dictation errors. Please contact me for any clarification or with any questions.   Electronically signed by:  Osvaldo Shipper, M.D. (PGY-2)    Gunnar Bulla, MD 07/18/23 Brooke Pace    Eber Hong, MD 07/19/23 [phone]

## 2023-10-08 ENCOUNTER — Encounter (HOSPITAL_COMMUNITY): Payer: Self-pay

## 2023-10-08 ENCOUNTER — Ambulatory Visit (HOSPITAL_COMMUNITY)
Admission: EM | Admit: 2023-10-08 | Discharge: 2023-10-08 | Disposition: A | Discharge status: Home / Self Care | Payer: Medicare (Managed Care) | Attending: Emergency Medicine | Admitting: Emergency Medicine

## 2023-10-08 DIAGNOSIS — M791 Myalgia, unspecified site: Secondary | ICD-10-CM | POA: Insufficient documentation

## 2023-10-08 DIAGNOSIS — M25469 Effusion, unspecified knee: Secondary | ICD-10-CM | POA: Diagnosis present

## 2023-10-08 MED ORDER — PREDNISONE 20 MG PO TABS: 40.0000 mg | ORAL_TABLET | Freq: Every day | ORAL | 0 refills | Status: AC

## 2023-10-08 NOTE — ED Triage Notes (Signed)
 Patient here today with c/o body pain, fatigue, weakness, and left knee tightness and swelling X 3 weeks. She has been taking Ibuprofen, BC powder with little relief.

## 2023-10-08 NOTE — ED Provider Notes (Signed)
 [facility]    CSN: [id number] Arrival date & time: 10/08/23  1538      History   Chief Complaint Chief Complaint  Patient presents with   Weakness    HPI Misty Burke is a 68 y.o. female. She c/o weakness for 3 weeks. Denies preceding illness. Has swelling in BLE, worse on left; c/o L knee swelling. Denies specific injury. Also c/o body aches all over,especially RUE and LLE. Pain is better first thing in the morning and gets worse over the course of the day. Rest helps improve pain. No morning stiffness. Has tried taking BC powders, ibuprofen, can curcumin for pain. Has seen a chiropractor for pain. Was taking an OTC collagn supplement but stopped taking it when pain started as she thought the supplement might be the cause of her pain.     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      Home Medications    Prior to Admission medications   Medication Sig Start Date End Date Taking? Authorizing Provider  Ascorbic Acid (VITAMIN  C) 1000 MG tablet Take 1,000 mg by mouth daily.    [provider]  B Complex Vitamins (B COMPLEX 1 PO) Take by mouth.    [provider]  cholecalciferol (VITAMIN D3) 25 MCG (1000 UT) tablet Take 1,000 Units by mouth daily.    [provider]  fexofenadine (ALLEGRA) 180 MG tablet Take 1 tablet (180 mg total) by mouth as needed. 01/24/21   Shade Flood, MD  fluticasone (FLONASE) 50 MCG/ACT nasal spray PLACE 1 TO 2 SPRAYS INTO BOTH NOSTRILS AT BEDTIME 04/20/19   Shade Flood, MD  ipratropium (ATROVENT) 0.06 % nasal spray Place 2 sprays into both nostrils 3 (three) times daily. As needed for nasal congestion, runny nose Patient not taking: Reported on 07/18/2023 01/11/23   Theadora Rama Scales, PA-C  Multiple Vitamin (MULTIVITAMIN) tablet Take 1 tablet by mouth daily.    [provider]    Family History Family History  Problem Relation Age of Onset   Cancer Other    Cancer Father    Diabetes Neg Hx    Hypertension Neg Hx    Breast cancer Neg Hx    Colon cancer Neg Hx    Esophageal cancer Neg Hx    Rectal cancer Neg Hx    Stomach cancer Neg Hx    Colon polyps Neg  Hx     Social History Social History   Tobacco Use   Smoking status: Never   Smokeless tobacco: Never  Vaping Use   Vaping status: Never Used  Substance Use Topics   Alcohol use: Yes    Comment: socially    Drug use: No     Allergies   Patient has no known allergies.   Review of Systems Review of Systems  Neurological:  Positive for weakness.     Physical Exam Triage Vital Signs ED Triage Vitals  Encounter Vitals Group     BP 10/08/23 1620 106/73     Systolic BP Percentile --      Diastolic BP Percentile --      Pulse Rate 10/08/23 1620 83     Resp 10/08/23 1620 16     Temp 10/08/23 1620 98.1 F (36.7 C)     Temp Source 10/08/23 1620 Oral     SpO2 10/08/23 1620 97 %     Weight --      Height 10/08/23 1624 5\' 10"  (1.778 m)     Head Circumference --       Peak Flow --      Pain Score 10/08/23 1623 9     Pain Loc --      Pain Education --      Exclude from Growth Chart --    No data found.  Updated Vital Signs BP 106/73 (BP Location: Right Arm)   Pulse 83   Temp 98.1 F (36.7 C) (Oral)   Resp 16   Ht 5\' 10"  (1.778 m)   LMP  (LMP Unknown)   SpO2 97%   BMI 33.72 kg/m   Visual Acuity Right Eye Distance:   Left Eye Distance:   Bilateral Distance:    Right Eye Near:   Left Eye Near:    Bilateral Near:     Physical Exam Constitutional:      Appearance: Normal appearance. She is obese. She is not ill-appearing.  Pulmonary:     Effort: Pulmonary effort is normal.  Musculoskeletal:     Right shoulder: Normal.     Right elbow: Normal.     Right wrist: Normal.     Right hand: Normal.     Right knee: No swelling. Normal range of motion.     Left knee: Swelling present. Normal range of motion.     Right lower leg: 1+ Pitting Edema present.     Left lower leg: 2+ Pitting Edema present.  Neurological:     Mental Status: She is alert.      UC Treatments / Results  Labs (all labs ordered are listed, but only abnormal results are displayed) Labs Reviewed  SARS CORONAVIRUS 2 (TAT 6-24 HRS)    EKG   Radiology No results found.  Procedures Procedures (including critical care time)  Medications Ordered in UC Medications - No data to display  Initial Impression / Assessment and Plan / UC Course  I have reviewed the triage vital signs and the nursing notes.  Pertinent labs & imaging results that were available during my care of the patient were reviewed by me and considered in my medical decision making (see chart for details).    Sx started around 3 weeks ago. I am not certain that all sx are related. Will try prednisone for pain/inflammation - she will let her PCP know if the prednisone improved her sx or not.   Final Clinical Impressions(s) / UC Diagnoses  Final diagnoses:  Edema of knee  Myalgia      Discharge Instructions      Follow up with your primary care provider about your overall achy feelings and ankle swelling. Stop taking ibuprofen while you are taking the 5 days of prednisone prescribed. Let your primary care provider know if the prednisone helped your symptoms or not.   Follow up with sports medicine about your knee swelling and pain and your arm pain (there are 2 clinics to try listed below - either one is fine - since you had trouble with places taking your insurance, I listed a couple of options).   You will get a call if covid test is positive or abnormal, you will not get a call if test is but you can check results in MyChart if you have a MyChart account.       ED Prescriptions     Medication Sig Dispense Auth. Provider   predniSONE (DELTASONE) 20 MG tablet Take 2 tablets (40 mg total) by mouth daily for 5 days. 10 tablet Cathlyn Parsons, NP      PDMP not reviewed this encounter.   Cathlyn Parsons, NP 10/22/23 [phone]

## 2023-10-08 NOTE — Discharge Instructions (Addendum)
 Follow up with your primary care provider about your overall achy feelings and ankle swelling. Stop taking ibuprofen while you are taking the 5 days of prednisone prescribed. Let your primary care provider know if the prednisone helped your symptoms or not.   Follow up with sports medicine about your knee swelling and pain and your arm pain (there are 2 clinics to try listed below - either one is fine - since you had trouble with places taking your insurance, I listed a couple of options).   You will get a call if covid test is positive or abnormal, you will not get a call if test is but you can check results in MyChart if you have a MyChart account.

## 2023-10-09 LAB — SARS CORONAVIRUS 2 (TAT 6-24 HRS): SARS Coronavirus 2: NEGATIVE

## 2023-11-01 ENCOUNTER — Ambulatory Visit (HOSPITAL_COMMUNITY)
Admission: EM | Admit: 2023-11-01 | Discharge: 2023-11-01 | Disposition: A | Discharge status: Home / Self Care | Payer: Medicare (Managed Care) | Attending: Emergency Medicine | Admitting: Emergency Medicine

## 2023-11-01 ENCOUNTER — Encounter (HOSPITAL_COMMUNITY): Payer: Self-pay

## 2023-11-01 DIAGNOSIS — K0889 Other specified disorders of teeth and supporting structures: Secondary | ICD-10-CM

## 2023-11-01 MED ORDER — AMOXICILLIN-POT CLAVULANATE 875-125 MG PO TABS: 1.0000 | ORAL_TABLET | Freq: Two times a day (BID) | ORAL | 0 refills | Status: DC

## 2023-11-01 NOTE — ED Provider Notes (Signed)
 [facility]    CSN: [id number] Arrival date & time: 11/01/23  1151      History   Chief Complaint Chief Complaint  Patient presents with   Dental Problem    HPI Misty Burke is a 68 y.o. female.  Here with several day history of right lower dental pain. Reporting gum swelling and 7/10 discomfort. Had a cap placed on this tooth several years ago but thinks it's out of place. Also has cavity here. 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      Home Medications    Prior to Admission medications   Medication Sig Start Date End Date Taking? Authorizing Provider  amoxicillin-clavulanate (AUGMENTIN) 875-125 MG tablet Take 1 tablet by mouth every 12 (twelve) hours for 7 days. 11/01/23 11/08/23 Yes Eliazar Olivar, Lurena Joiner, PA-C  Ascorbic Acid (VITAMIN C) 1000 MG tablet Take 1,000 mg by mouth daily.    [provider]  B Complex Vitamins (B COMPLEX 1 PO) Take by mouth.    [provider]  cholecalciferol (VITAMIN D3) 25 MCG  (1000 UT) tablet Take 1,000 Units by mouth daily.    [provider]  fexofenadine (ALLEGRA) 180 MG tablet Take 1 tablet (180 mg total) by mouth as needed. 01/24/21   Shade Flood, MD  fluticasone (FLONASE) 50 MCG/ACT nasal spray PLACE 1 TO 2 SPRAYS INTO BOTH NOSTRILS AT BEDTIME 04/20/19   Shade Flood, MD  ipratropium (ATROVENT) 0.06 % nasal spray Place 2 sprays into both nostrils 3 (three) times daily. As needed for nasal congestion, runny nose Patient not taking: Reported on 07/18/2023 01/11/23   Theadora Rama Scales, PA-C  Multiple Vitamin (MULTIVITAMIN) tablet Take 1 tablet by mouth daily.    [provider]    Family History Family History  Problem Relation Age of Onset   Cancer Other    Cancer Father    Diabetes Neg Hx    Hypertension Neg Hx    Breast cancer Neg Hx    Colon cancer Neg Hx    Esophageal cancer Neg Hx    Rectal cancer Neg Hx    Stomach cancer Neg Hx    Colon polyps Neg Hx     Social History Social History   Tobacco Use   Smoking status: Never   Smokeless tobacco: Never  Vaping Use   Vaping status: Never Used  Substance Use Topics   Alcohol  use: Yes    Comment: socially    Drug use: No     Allergies   Patient has no known allergies.   Review of Systems Review of Systems Per HPI  Physical Exam Triage Vital Signs ED Triage Vitals  Encounter Vitals Group     BP 11/01/23 1331 (!) 140/84     Systolic BP Percentile --      Diastolic BP Percentile --      Pulse Rate 11/01/23 1331 76     Resp 11/01/23 1331 16     Temp 11/01/23 1331 97.9 F (36.6 C)     Temp Source 11/01/23 1331 Oral     SpO2 11/01/23 1331 96 %     Weight --      Height --      Head Circumference --      Peak Flow --      Pain Score 11/01/23 1333 7     Pain Loc --      Pain Education --      Exclude from Growth Chart --    No data found.  Updated Vital Signs BP (!) 140/84 (BP Location: Left Arm)   Pulse 76   Temp 97.9 F (36.6 C) (Oral)    Resp 16   LMP  (LMP Unknown)   SpO2 96%   Visual Acuity Right Eye Distance:   Left Eye Distance:   Bilateral Distance:    Right Eye Near:   Left Eye Near:    Bilateral Near:     Physical Exam Vitals and nursing note reviewed.  Constitutional:      General: She is not in acute distress.    Appearance: Normal appearance.  HENT:     Mouth/Throat:     Mouth: Mucous membranes are moist.     Dentition: Abnormal dentition. Dental tenderness and dental caries present. No gingival swelling.     Pharynx: Oropharynx is clear.  Cardiovascular:     Rate and Rhythm: Normal rate and regular rhythm.     Pulses: Normal pulses.     Heart sounds: Normal heart sounds.  Pulmonary:     Effort: Pulmonary effort is normal.     Breath sounds: Normal breath sounds.  Neurological:     Mental Status: She is alert and oriented to person, place, and time.      UC Treatments / Results  Labs (all labs ordered are listed, but only abnormal results are displayed) Labs Reviewed - No data to display  EKG   Radiology No results found.  Procedures Procedures (including critical care time)  Medications Ordered in UC Medications - No data to display  Initial Impression / Assessment and Plan / UC Course  I have reviewed the triage vital signs and the nursing notes.  Pertinent labs & imaging results that were available during my care of the patient were reviewed by me and considered in my medical decision making (see chart for details).  Afebrile. Well appearing. Airway patent, no facial or oral swelling. Cover for dental infection with Augmentin twice daily for 7 days.  Provided list of dental resources for follow-up.  Patient agrees to plan  Final Clinical Impressions(s) / UC Diagnoses   Final diagnoses:  Pain, dental     Discharge Instructions      Please take medication as prescribed. Take with food to avoid upset stomach.  Please follow with dentist There is a list of some local  clinics attached  You can also go to  [facility] [address] [place] [phone]     ED Prescriptions     Medication Sig Dispense Auth. Provider   amoxicillin-clavulanate (AUGMENTIN) 875-125 MG tablet Take 1 tablet by mouth every 12 (twelve) hours for 7 days. 14 tablet Larkin Alfred, Lurena Joiner, PA-C      PDMP not reviewed this encounter.   Donavan Kerlin, Lurena Joiner, [place] 11/01/23 1406

## 2023-11-01 NOTE — Discharge Instructions (Signed)
 Please take medication as prescribed. Take with food to avoid upset stomach.  Please follow with dentist There is a list of some local clinics attached  You can also go to [facility] [address] [place] [phone]

## 2023-11-01 NOTE — ED Triage Notes (Signed)
 Patient is here with dental pain and swelling. Patient recently had a cap placed on her back bottom tooth.

## 2023-11-08 ENCOUNTER — Ambulatory Visit (HOSPITAL_COMMUNITY)
Admission: EM | Admit: 2023-11-08 | Discharge: 2023-11-08 | Disposition: A | Discharge status: Home / Self Care | Payer: Medicare (Managed Care) | Attending: Emergency Medicine | Admitting: Emergency Medicine

## 2023-11-08 ENCOUNTER — Encounter (HOSPITAL_COMMUNITY): Payer: Self-pay | Admitting: Emergency Medicine

## 2023-11-08 DIAGNOSIS — K0889 Other specified disorders of teeth and supporting structures: Secondary | ICD-10-CM | POA: Diagnosis not present

## 2023-11-08 MED ORDER — NYSTATIN 100000 UNIT/ML MT SUSP: 5.0000 mL | Freq: Three times a day (TID) | OROMUCOSAL | 0 refills | Status: AC

## 2023-11-08 MED ORDER — CLINDAMYCIN HCL 300 MG PO CAPS: 300.0000 mg | ORAL_CAPSULE | Freq: Three times a day (TID) | ORAL | 0 refills | Status: AC

## 2023-11-08 NOTE — ED Notes (Signed)
 Ice pack given

## 2023-11-08 NOTE — Discharge Instructions (Addendum)
 Start taking clindamycin 3 times daily for 7 days.  Do not take this more than 3 times a day. You can use Magic mouthwash 3 times daily to assist with dental pain. I have attached a dental resource guide with multiple locations that you can call around to see who can get you in this dentist regarding her dental pain. Return here as needed.

## 2023-11-08 NOTE — ED Triage Notes (Signed)
 Pt thought she cured her dental infection with antibiotics that was prescribed 4/6. Reports having trouble getting in with dentist. Having swelling in right side of face again with pain.

## 2023-11-08 NOTE — ED Provider Notes (Signed)
 [facility]    CSN: [id number] Arrival date & time: 11/08/23  1721      History   Chief Complaint Chief Complaint  Patient presents with   Dental Problem    HPI Misty Burke is a 68 y.o. female.   Patient presents with right sided dental pain and swelling.  Patient also endorses right-sided facial swelling.  Patient was seen here on 4/6 and was given Augmentin for dental infection prevention.  Patient states that she has had trouble getting in with the dentist.  Patient states that she took Augmentin more than twice daily and finished this on 4/10.  Patient states that her symptoms started to get a little better but then became worse again.     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      Home Medications    Prior to Admission medications   Medication Sig Start Date End Date Taking? Authorizing Provider  clindamycin (CLEOCIN) 300 MG capsule Take 1 capsule (300 mg total) by mouth 3 (three) times daily for 7 days. 11/08/23 11/15/23 Yes  Levora Reas A, NP  magic mouthwash (nystatin, lidocaine, diphenhydrAMINE, alum & mag hydroxide) suspension Swish and spit 5 mLs 3 (three) times daily. 11/08/23  Yes Levora Reas A, NP  Ascorbic Acid (VITAMIN C) 1000 MG tablet Take 1,000 mg by mouth daily.    [provider]  B Complex Vitamins (B COMPLEX 1 PO) Take by mouth.    [provider]  cholecalciferol (VITAMIN D3) 25 MCG (1000 UT) tablet Take 1,000 Units by mouth daily.    [provider]  fexofenadine (ALLEGRA) 180 MG tablet Take 1 tablet (180 mg total) by mouth as needed. 01/24/21   Benjiman Bras, MD  fluticasone (FLONASE) 50 MCG/ACT nasal spray PLACE 1 TO 2 SPRAYS INTO BOTH NOSTRILS AT BEDTIME 04/20/19   Greene, Jeffrey R, MD  ipratropium (ATROVENT) 0.06 % nasal spray Place 2 sprays into both nostrils 3 (three) times daily. As needed for nasal congestion, runny nose Patient not taking: Reported on 07/18/2023 01/11/23   Eloise Hake Scales, PA-C  Multiple Vitamin (MULTIVITAMIN) tablet Take 1 tablet by mouth daily.    [provider]    Family History Family History  Problem Relation Age of Onset   Cancer Other    Cancer Father    Diabetes Neg Hx    Hypertension Neg Hx    Breast cancer Neg  Hx    Colon cancer Neg Hx    Esophageal cancer Neg Hx    Rectal cancer Neg Hx    Stomach cancer Neg Hx    Colon polyps Neg Hx     Social History Social History   Tobacco Use   Smoking status: Never   Smokeless tobacco: Never  Vaping Use   Vaping status: Never Used  Substance Use Topics   Alcohol use: Yes    Comment: socially    Drug use: No     Allergies   Patient has no known allergies.   Review of Systems Review of Systems  Per HPI  Physical Exam Triage Vital Signs ED Triage Vitals  Encounter Vitals Group     BP 11/08/23 1731 113/76     Systolic BP Percentile --      Diastolic BP Percentile --      Pulse Rate 11/08/23 1731 78     Resp 11/08/23 1731 18     Temp  11/08/23 1731 98.1 F (36.7 C)     Temp Source 11/08/23 1731 Oral     SpO2 11/08/23 1731 96 %     Weight --      Height --      Head Circumference --      Peak Flow --      Pain Score 11/08/23 1730 9     Pain Loc --      Pain Education --      Exclude from Growth Chart --    No data found.  Updated Vital Signs BP 113/76 (BP Location: Right Arm)   Pulse 78   Temp 98.1 F (36.7 C) (Oral)   Resp 18   LMP  (LMP Unknown)   SpO2 96%   Visual Acuity Right Eye Distance:   Left Eye Distance:   Bilateral Distance:    Right Eye Near:   Left Eye Near:    Bilateral Near:     Physical Exam Vitals and nursing note reviewed.  Constitutional:      General: She is awake. She is not in acute distress.    Appearance: Normal appearance. She is well-developed and well-groomed. She is not ill-appearing.  HENT:     Mouth/Throat:     Dentition: Abnormal dentition. Dental tenderness and gingival swelling present. No dental abscesses.  Neurological:     Mental Status: She is alert.  Psychiatric:        Behavior: Behavior is cooperative.      UC Treatments / Results  Labs (all labs ordered are listed, but only abnormal results are displayed) Labs Reviewed - No data to display  EKG   Radiology No results found.  Procedures Procedures (including critical care time)  Medications Ordered in UC Medications - No data to display  Initial Impression / Assessment and Plan / UC Course  I have reviewed the triage vital signs and the nursing notes.  Pertinent labs & imaging results that were available during my care of the patient were reviewed by me and considered in my medical decision making (see chart for details).     Patient is well-appearing.  Vitals are stable.  Afebrile. Prescribed clindamycin for dental infection coverage.  Prescribed Magic mouthwash to assist with dental pain and swelling.  Given list of dental resources to follow-up with.  Discussed return  precautions Final diagnoses:  Pain, dental     Discharge Instructions      Start taking clindamycin 3 times daily for 7 days.  Do not take this more than 3 times a day. You can use Magic mouthwash 3 times daily to assist with dental pain. I have attached a dental resource guide with multiple locations that you can call around to see who can get you in this dentist regarding her dental pain. Return here as needed.   ED Prescriptions     Medication Sig Dispense Auth. Provider   clindamycin (CLEOCIN) 300 MG capsule Take 1 capsule (300 mg total) by mouth 3 (three) times daily for 7 days. 21 capsule Levora Reas A, NP   magic mouthwash (nystatin, lidocaine, diphenhydrAMINE, alum & mag hydroxide) suspension Swish and spit 5 mLs 3 (three) times daily. 180 mL Levora Reas A, NP      PDMP not reviewed this encounter.   Levora Reas A, NP 11/08/23 [phone]

## 2023-11-09 ENCOUNTER — Telehealth (HOSPITAL_COMMUNITY): Payer: Self-pay

## 2023-11-09 MED ORDER — LIDOCAINE VISCOUS HCL 2 % MT SOLN
15.0000 mL | OROMUCOSAL | 0 refills | Status: AC | PRN
Start: 2023-11-09 — End: ?

## 2023-11-09 NOTE — Telephone Encounter (Signed)
 Pt called stating magic mouthwash was unavailable. Per R. Rising, PA-C can send in vicious lidocaine swish and spit. Reviewed with patient, verified pharmacy, prescription sent

## 2023-11-23 NOTE — Progress Notes (Signed)
 Patient attending screening event at [facility]

## 2024-01-27 NOTE — Progress Notes (Signed)
 The patient attended a screening event on 11/16/2023 where her blood pressure was measured at 137/87 mmHg. During the event, the patient reported that she does not smoke, has [employer], pt did not document a PCP, and does not have any SDOH concerns documented.   A chart review confirmed that the patient does not have an active PCP. She has Medicare for insurance and no SDOH needs indicated per chart review. Chart review further indicated Dr. Talitha Flatten -[facility] as a previous documented PCP according to Care Team section from 11/30/23 office visit summary with Dr. Greig Blower - [facility]. Pt does not have any [facility] visible upcoming appts with Dr. Flatten. Pt also had previous UC and ED encounters within the last 12 months. Pt has upcoming Derm appt with Dr. Blower on 06/13/24 at 3:30 PM.  CHW called [facility] to obtain PCP status. The office shared that her last appt was on 06/11/2021. CHW encouraged office to call pt for follow up visit. CHW called pt to obtain PCP status and discuss screening results. Pt shared that her provider is in [facility] but has not seen her in over a year, she shared that she has dual residency with [place] and [place]. Due to this she has had some issues with her [facility] Medicare because she has it from [place]. [facility] previously connected her to a provider in [place] (Dr. Flatten) and she has been trying to find someone in [place] that accepts the insurance. Pt shared she was also stressed and did not eat until the cooking class which is why her bp may have been elevated. Pt shared that she will reach out to the insurance to see if any providers in [place] will accept her insurance. CHW asked pt if she wishes to get receive [facility] PCP resources. Pt agreed to receiving resources and will contact either Dr. Sheria office or a [facility] PCP once  insurance questions are answered. CHW sent letter via email and mail with Get Care Now, [facility], [facility]U calendar, bp and stress management education. In case needed by pt.  An additional follow up will be done at a later date per the Health Equity Teams protocol.

## 2024-04-07 NOTE — Progress Notes (Signed)
 Pt media form from event has not been uploaded into [employer]. Per chart review of initial Health Equity f/u pt attended 11/16/23 screening event where her bp was 137/87. Pt did not document having a PCP and noted having insurance under medicare. Pt did not document any SDOH needs. Initial health equity f/u CHW was able to contact pt and was told that pt travels between [place] and [place] and that has caused some issues with her medicare and finding a [place] provider that will accept her insurance.  Further chart review indicates that pt does have an up and coming appt with [facility] on 06/13/2024. There are no other [facility] visible appts. This CHW called pt to follow up on PCP status and left a vm asking pt to return call at her earliest convenience. Letter sent to pt with Get care now flyer, community clinics and BP management education information. Pt to be scheduled for a future follow up per health equity protocol.

## 2024-07-20 NOTE — Progress Notes (Signed)
 Pt attended 11/16/2023 screening event with BP of 137/87.  Per 6 month f/u pt was reached out via phone and pt stated that she has been out of state and back in [place] since July 2025 and hasn't been by to check mail in [place]. Pt also stated that hopefully she will be back in [place] sometime next year and even though she has a PCP in [place], would still like to have another PCP in [place] just in case. CHW & pt agreed that another letter with PCP resources will be sent just in case other letter is no longer at address and we will follow up to check in on PCP status. Pt was sent 3rd letter with Get Care Now flyer, multiple community primary care clinics, BP management flyer, & BP log.  Per chart review pt does not have a PCP, has insurance, and is not a smoker. Pt does not indicate any SDOH needs at this time.  Additional pt f/u to be scheduled at this time per health equity protocol.
